# Patient Record
Sex: Female | Born: 1995 | Race: Black or African American | Hispanic: No | Marital: Single | Smoking: Never smoker
Health system: Southern US, Community
[De-identification: ages and names within clinical notes are randomized; demographics above are authoritative.]

## PROBLEM LIST (undated history)

## (undated) ENCOUNTER — Inpatient Hospital Stay (HOSPITAL_COMMUNITY): Payer: Self-pay

## (undated) DIAGNOSIS — Z789 Other specified health status: Secondary | ICD-10-CM

## (undated) HISTORY — PX: NO PAST SURGERIES: SHX2092

## (undated) HISTORY — PX: TOOTH EXTRACTION: SUR596

---

## 2016-09-02 NOTE — L&D Delivery Note (Signed)
Delivery Note Pt began with pressure and an urge to push just prior to delivery. She pushed well with a couple of ctx and at 12:29 AM a viable female was delivered via Vaginal, Spontaneous Delivery (Presentation: LOA ).  APGAR: 8, 9; weight: pending.  Infant dried and placed on pt's abd; family requested delayed cord clamping; cord clamped and cut by FOB; hospital cord blood sample collected Placenta status: spont, intact .  Cord: 3 vessel  Anesthesia: None  Episiotomy: None Lacerations: None Est. Blood Loss (mL): initially 150, then I was called back to the room shortly after leaving to eval a trickle/clots; bimanual exam removed some vag clots, and now total EBL 500cc- given cytotec buccally  Mom to postpartum.  Baby to Couplet care / Skin to Skin.  Cam Hai CNM 04/22/2017, 12:43 AM

## 2016-09-19 ENCOUNTER — Encounter (HOSPITAL_COMMUNITY): Payer: Self-pay | Admitting: Emergency Medicine

## 2016-09-19 ENCOUNTER — Ambulatory Visit (HOSPITAL_COMMUNITY)
Admission: EM | Admit: 2016-09-19 | Discharge: 2016-09-19 | Disposition: A | Discharge status: Home / Self Care | Payer: Medicaid Other | Attending: Emergency Medicine | Admitting: Emergency Medicine

## 2016-09-19 DIAGNOSIS — R197 Diarrhea, unspecified: Secondary | ICD-10-CM | POA: Diagnosis not present

## 2016-09-19 DIAGNOSIS — L509 Urticaria, unspecified: Secondary | ICD-10-CM | POA: Diagnosis not present

## 2016-09-19 DIAGNOSIS — R1030 Lower abdominal pain, unspecified: Secondary | ICD-10-CM | POA: Insufficient documentation

## 2016-09-19 DIAGNOSIS — R196 Halitosis: Secondary | ICD-10-CM

## 2016-09-19 DIAGNOSIS — G43009 Migraine without aura, not intractable, without status migrainosus: Secondary | ICD-10-CM

## 2016-09-19 DIAGNOSIS — Z79899 Other long term (current) drug therapy: Secondary | ICD-10-CM | POA: Insufficient documentation

## 2016-09-19 DIAGNOSIS — R109 Unspecified abdominal pain: Secondary | ICD-10-CM | POA: Diagnosis present

## 2016-09-19 MED ORDER — PREDNISONE 10 MG PO TABS: ORAL_TABLET | ORAL | 0 refills | Status: DC

## 2016-09-19 MED ORDER — LOPERAMIDE HCL 2 MG PO CAPS: 2.0000 mg | ORAL_CAPSULE | Freq: Two times a day (BID) | ORAL | 0 refills | Status: DC | PRN

## 2016-09-19 MED ORDER — KETOROLAC TROMETHAMINE 60 MG/2ML IM SOLN
INTRAMUSCULAR | Status: AC
  Filled 2016-09-19: qty 2

## 2016-09-19 MED ORDER — DEXAMETHASONE SODIUM PHOSPHATE 10 MG/ML IJ SOLN
10.0000 mg | Freq: Once | INTRAMUSCULAR | Status: AC
  Administered 2016-09-19: 10 mg via INTRAMUSCULAR

## 2016-09-19 MED ORDER — KETOROLAC TROMETHAMINE 60 MG/2ML IM SOLN
60.0000 mg | Freq: Once | INTRAMUSCULAR | Status: AC
  Administered 2016-09-19: 60 mg via INTRAMUSCULAR

## 2016-09-19 MED ORDER — DEXAMETHASONE SODIUM PHOSPHATE 10 MG/ML IJ SOLN
INTRAMUSCULAR | Status: AC
  Filled 2016-09-19: qty 1

## 2016-09-19 NOTE — ED Provider Notes (Signed)
[facility]    CSN: [id number] Arrival date & time: 09/19/16  1543     History   Chief Complaint Chief Complaint  Patient presents with  . Abdominal Pain    HPI Kelsey Hanson is a 21 y.o. female.   HPI She is a 89 year old woman here for evaluation of abdominal pain and diarrhea. She reports a four-week history of diarrhea. She reports having a bowel movement every 2 hours or so.Marland Kitchen Described as loose to watery. No blood in the stool. This is associated with some lower abdominal pain. Denies nausea or vomiting. No fevers. No recent travel out of the country.   She also reports a daily migraine for the last 2 weeks. It is her typical migraine. Initially, it was responding to OTC medications, but she stopped taking these due to hives.  She reports a month or so history of frequent hives. They respond to cool oatmeal baths.  History reviewed. No pertinent past medical history.  There are no active problems to display for this patient.   History reviewed. No pertinent surgical history.  OB History    No data available       Home Medications    Prior to Admission medications   Medication Sig Start Date End Date Taking? Authorizing Provider  loperamide (IMODIUM) 2 MG capsule Take 1 capsule (2 mg total) by mouth 2 (two) times daily as needed for diarrhea or loose stools. 09/19/16   Charm Rings, MD  predniSONE (DELTASONE) 10 MG tablet Take 6 tablets on day 1, 5 on day 2, 4 on day 3, 3 on day 4, 2 on day 5, 1 on day 6. Start 09/20/2016. 09/19/16   Charm Rings, MD    Family History No family history on file.  Social History Social History  Substance Use Topics  . Smoking status: Never Smoker  . Smokeless tobacco: Never Used  . Alcohol use No     Allergies   Patient has no known allergies.   Review of Systems Review of Systems As in history of present illness  Physical Exam Triage Vital Signs ED Triage Vitals  Enc Vitals Group     BP 09/19/16 1618  133/81     Pulse Rate 09/19/16 1618 98     Resp 09/19/16 1618 16     Temp 09/19/16 1618 99.4 F (37.4 C)     Temp Source 09/19/16 1618 Oral     SpO2 09/19/16 1618 100 %     Weight --      Height --      Head Circumference --      Peak Flow --      Pain Score 09/19/16 1617 8     Pain Loc --      Pain Edu? --      Excl. in GC? --    No data found.   Updated Vital Signs BP 133/81 (BP Location: Left Arm)   Pulse 98   Temp 99.4 F (37.4 C) (Oral)   Resp 16   LMP 08/26/2016   SpO2 100%   Visual Acuity Right Eye Distance:   Left Eye Distance:   Bilateral Distance:    Right Eye Near:   Left Eye Near:    Bilateral Near:     Physical Exam  Constitutional: She is oriented to person, place, and time. She appears well-developed and well-nourished. No distress.  HENT:  Mouth/Throat: Oropharynx is clear and moist.  Neck: Neck supple.  Cardiovascular: Normal  rate, regular rhythm and normal heart sounds.   No murmur heard. Pulmonary/Chest: Effort normal and breath sounds normal. No respiratory distress. She has no wheezes. She has no rales.  Abdominal: Soft. Bowel sounds are normal. She exhibits no distension. There is tenderness (across lower abdomen and epigastric). There is no rebound and no guarding.  Neurological: She is alert and oriented to person, place, and time.  Skin: No rash noted.     UC Treatments / Results  Labs (all labs ordered are listed, but only abnormal results are displayed) Labs Reviewed  GASTROINTESTINAL PANEL BY PCR, STOOL (REPLACES STOOL CULTURE)    EKG  EKG Interpretation None       Radiology No results found.  Procedures Procedures (including critical care time)  Medications Ordered in UC Medications  ketorolac (TORADOL) injection 60 mg (60 mg Intramuscular Given 09/19/16 1719)  dexamethasone (DECADRON) injection 10 mg (10 mg Intramuscular Given 09/19/16 1720)     Initial Impression / Assessment and Plan / UC Course  I have  reviewed the triage vital signs and the nursing notes.  Pertinent labs & imaging results that were available during my care of the patient were reviewed by me and considered in my medical decision making (see chart for details).     Migraine treated with Toradol and Decadron. Prednisone taper to help with headache and hives. Stool culture sent. Recommended loperamide twice a day as needed to help with the diarrhea. Follow-up as needed.  Final Clinical Impressions(s) / UC Diagnoses   Final diagnoses:  Diarrhea, unspecified type  Migraine without aura and without status migrainosus, not intractable  Bad breath  Hives    New Prescriptions New Prescriptions   LOPERAMIDE (IMODIUM) 2 MG CAPSULE    Take 1 capsule (2 mg total) by mouth 2 (two) times daily as needed for diarrhea or loose stools.   PREDNISONE (DELTASONE) 10 MG TABLET    Take 6 tablets on day 1, 5 on day 2, 4 on day 3, 3 on day 4, 2 on day 5, 1 on day 6. Start 09/20/2016.     Charm RingsErin J Herman Fiero, MD 09/19/16 [phone]

## 2016-09-19 NOTE — ED Triage Notes (Signed)
Here abd pain onset 08/24/16 associated w/constant diarrhea, HA x2 weeks  Did not come in sooner due to lack of transportation   Denies fevers, n/v, urinary sx  Not taking any meds   A&O x4... NAD

## 2016-09-19 NOTE — ED Notes (Signed)
Patient sent to the bathroom

## 2016-09-19 NOTE — Discharge Instructions (Signed)
We gave you some medication here to help with the headache. Take the prednisone taper as prescribed. With a headache as well as the hives. We are going to test your stool for infection. We will call you with the results in a few days. In the meantime, take loperamide twice a day as needed for diarrhea. If your symptoms do not improve, we may need to refer you to a GI specialist.

## 2016-09-19 NOTE — ED Notes (Signed)
Patient has returned to treatment room from bathroom

## 2016-09-20 LAB — GASTROINTESTINAL PANEL BY PCR, STOOL (REPLACES STOOL CULTURE)
ADENOVIRUS F40/41: NOT DETECTED
ASTROVIRUS: NOT DETECTED
CAMPYLOBACTER SPECIES: NOT DETECTED
CYCLOSPORA CAYETANENSIS: NOT DETECTED
Cryptosporidium: NOT DETECTED
ENTAMOEBA HISTOLYTICA: NOT DETECTED
ENTEROPATHOGENIC E COLI (EPEC): NOT DETECTED
ENTEROTOXIGENIC E COLI (ETEC): NOT DETECTED
Enteroaggregative E coli (EAEC): NOT DETECTED
Giardia lamblia: NOT DETECTED
Norovirus GI/GII: NOT DETECTED
Plesimonas shigelloides: NOT DETECTED
Rotavirus A: NOT DETECTED
Salmonella species: NOT DETECTED
Sapovirus (I, II, IV, and V): NOT DETECTED
Shiga like toxin producing E coli (STEC): NOT DETECTED
Shigella/Enteroinvasive E coli (EIEC): NOT DETECTED
VIBRIO CHOLERAE: NOT DETECTED
VIBRIO SPECIES: NOT DETECTED
Yersinia enterocolitica: NOT DETECTED

## 2016-10-14 ENCOUNTER — Inpatient Hospital Stay (HOSPITAL_COMMUNITY)
Admission: AD | Admit: 2016-10-14 | Discharge: 2016-10-15 | Disposition: A | Discharge status: Home / Self Care | Payer: Medicaid Other | Source: Ambulatory Visit | Attending: Obstetrics and Gynecology | Admitting: Obstetrics and Gynecology

## 2016-10-14 ENCOUNTER — Inpatient Hospital Stay (HOSPITAL_COMMUNITY): Payer: Medicaid Other

## 2016-10-14 ENCOUNTER — Encounter (HOSPITAL_COMMUNITY): Payer: Self-pay | Admitting: *Deleted

## 2016-10-14 DIAGNOSIS — K219 Gastro-esophageal reflux disease without esophagitis: Secondary | ICD-10-CM | POA: Insufficient documentation

## 2016-10-14 DIAGNOSIS — R300 Dysuria: Secondary | ICD-10-CM | POA: Insufficient documentation

## 2016-10-14 DIAGNOSIS — O2341 Unspecified infection of urinary tract in pregnancy, first trimester: Secondary | ICD-10-CM | POA: Insufficient documentation

## 2016-10-14 DIAGNOSIS — O468X1 Other antepartum hemorrhage, first trimester: Secondary | ICD-10-CM

## 2016-10-14 DIAGNOSIS — O219 Vomiting of pregnancy, unspecified: Secondary | ICD-10-CM | POA: Insufficient documentation

## 2016-10-14 DIAGNOSIS — Z79899 Other long term (current) drug therapy: Secondary | ICD-10-CM | POA: Diagnosis not present

## 2016-10-14 DIAGNOSIS — O99611 Diseases of the digestive system complicating pregnancy, first trimester: Secondary | ICD-10-CM | POA: Diagnosis not present

## 2016-10-14 DIAGNOSIS — O418X10 Other specified disorders of amniotic fluid and membranes, first trimester, not applicable or unspecified: Secondary | ICD-10-CM | POA: Diagnosis not present

## 2016-10-14 DIAGNOSIS — O208 Other hemorrhage in early pregnancy: Secondary | ICD-10-CM | POA: Diagnosis not present

## 2016-10-14 DIAGNOSIS — O26891 Other specified pregnancy related conditions, first trimester: Secondary | ICD-10-CM | POA: Insufficient documentation

## 2016-10-14 DIAGNOSIS — R1030 Lower abdominal pain, unspecified: Secondary | ICD-10-CM | POA: Diagnosis not present

## 2016-10-14 LAB — URINALYSIS, ROUTINE W REFLEX MICROSCOPIC
BILIRUBIN URINE: NEGATIVE
Glucose, UA: NEGATIVE mg/dL
KETONES UR: 20 mg/dL — AB
NITRITE: NEGATIVE
PROTEIN: 30 mg/dL — AB
SPECIFIC GRAVITY, URINE: 1.013 (ref 1.005–1.030)
pH: 5 (ref 5.0–8.0)

## 2016-10-14 LAB — CBC
HCT: 34 % — ABNORMAL LOW (ref 36.0–46.0)
Hemoglobin: 11.3 g/dL — ABNORMAL LOW (ref 12.0–15.0)
MCH: 25.6 pg — ABNORMAL LOW (ref 26.0–34.0)
MCHC: 33.2 g/dL (ref 30.0–36.0)
MCV: 77.1 fL — ABNORMAL LOW (ref 78.0–100.0)
Platelets: 262 K/uL (ref 150–400)
RBC: 4.41 MIL/uL (ref 3.87–5.11)
RDW: 15.2 % (ref 11.5–15.5)
WBC: 9.4 K/uL (ref 4.0–10.5)

## 2016-10-14 LAB — POCT PREGNANCY, URINE: Preg Test, Ur: POSITIVE — AB

## 2016-10-14 LAB — HCG, QUANTITATIVE, PREGNANCY: hCG, Beta Chain, Quant, S: 85674 m[IU]/mL — ABNORMAL HIGH

## 2016-10-14 MED ORDER — PROMETHAZINE HCL 25 MG PO TABS: 25.0000 mg | ORAL_TABLET | Freq: Four times a day (QID) | ORAL | 1 refills | Status: DC | PRN

## 2016-10-14 MED ORDER — PHENAZOPYRIDINE HCL 200 MG PO TABS: 200.0000 mg | ORAL_TABLET | Freq: Three times a day (TID) | ORAL | 0 refills | Status: DC | PRN

## 2016-10-14 MED ORDER — CEPHALEXIN 500 MG PO CAPS: 500.0000 mg | ORAL_CAPSULE | Freq: Four times a day (QID) | ORAL | 0 refills | Status: DC

## 2016-10-14 MED ORDER — CONCEPT OB 130-92.4-1 MG PO CAPS: 1.0000 | ORAL_CAPSULE | Freq: Every day | ORAL | 12 refills | Status: DC

## 2016-10-14 NOTE — MAU Note (Signed)
PT  SAYS   LAST   NIGHT SHE  WOKE  - WITH  CRAMPING - WITH  B-ROOM - UNABLE  TO VOID.    THEN  TODAY  SHE  NOTICED  BLOOD  ON TOILET  PAPER.  SAYS SHE  CANNOT  CONTROL  HER  URINE-    HAS  BURNING  -   STARTED    1 WEEK  AGO.      NO HPT.      NO BIRTH  CONTROL      NO  DR.

## 2016-10-14 NOTE — Discharge Instructions (Signed)
Subchorionic Hematoma A subchorionic hematoma is a gathering of blood between the outer wall of the placenta and the inner wall of the womb (uterus). The placenta is the organ that connects the fetus to the wall of the uterus. The placenta performs the feeding, breathing (oxygen to the fetus), and waste removal (excretory work) of the fetus.  Subchorionic hematoma is the most common abnormality found on a result from ultrasonography done during the first trimester or early second trimester of pregnancy. If there has been little or no vaginal bleeding, early small hematomas usually shrink on their own and do not affect your baby or pregnancy. The blood is gradually absorbed over 1-2 weeks. When bleeding starts later in pregnancy or the hematoma is larger or occurs in an older pregnant woman, the outcome may not be as good. Larger hematomas may get bigger, which increases the chances for miscarriage. Subchorionic hematoma also increases the risk of premature detachment of the placenta from the uterus, preterm (premature) labor, and stillbirth. HOME CARE INSTRUCTIONS  Stay on bed rest if your health care provider recommends this. Although bed rest will not prevent more bleeding or prevent a miscarriage, your health care provider may recommend bed rest until you are advised otherwise.  Avoid heavy lifting (more than 10 lb [4.5 kg]), exercise, sexual intercourse, or douching as directed by your health care provider.  Keep track of the number of pads you use each day and how soaked (saturated) they are. Write down this information.  Do not use tampons.  Keep all follow-up appointments as directed by your health care provider. Your health care provider may ask you to have follow-up blood tests or ultrasound tests or both. SEEK IMMEDIATE MEDICAL CARE IF:  You have severe cramps in your stomach, back, abdomen, or pelvis.  You have a fever.  You pass large clots or tissue. Save any tissue for your health  care provider to look at.  Your bleeding increases or you become lightheaded, feel weak, or have fainting episodes. This information is not intended to replace advice given to you by your health care provider. Make sure you discuss any questions you have with your health care provider. Document Released: 12/04/2006 Document Revised: 09/09/2014 Document Reviewed: 03/18/2013 Elsevier Interactive Patient Education  2017 [employer]   Pregnancy and Urinary Tract Infection WHAT IS A URINARY TRACT INFECTION? A urinary tract infection (UTI) is an infection of any part of the urinary tract. This includes the kidneys, the tubes that connect your kidneys to your bladder (ureters), the bladder, and the tube that carries urine out of your body (urethra). These organs make, store, and get rid of urine in the body. A UTI can be a bladder infection (cystitis) or a kidney infection (pyelonephritis). This infection may be caused by fungi, viruses, and bacteria. Bacteria are the most common cause of UTIs. You are more likely to develop a UTI during pregnancy because:  The physical and hormonal changes your body goes through can make it easier for bacteria to get into your urinary tract.  Your growing baby puts pressure on your uterus and can affect urine flow. DOES A UTI PLACE MY BABY AT RISK? An untreated UTI during pregnancy could lead to a kidney infection, which can cause health problems that could affect your baby. Possible complications of an untreated UTI include:  Having your baby before 37 weeks of pregnancy (premature).  Having a baby with a low birth weight.  Developing high blood pressure during pregnancy (preeclampsia). WHAT  ARE THE SYMPTOMS OF A UTI? Symptoms of a UTI include:  Fever.  Frequent urination or passing small amounts of urine frequently.  Needing to urinate urgently.  Pain or a burning sensation with urination.  Urine that smells bad or unusual.  Cloudy urine.  Pain  in the lower abdomen or back.  Trouble urinating.  Blood in the urine.  Vomiting or being less hungry than normal.  Diarrhea or abdominal pain.  Vaginal discharge. WHAT ARE THE TREATMENT OPTIONS FOR A UTI DURING PREGNANCY? Treatment for this condition may include:  Antibiotic medicines that are safe to take during pregnancy.  Other medicines to treat less common causes of UTI. HOW CAN I PREVENT A UTI? To prevent a UTI:  Go to the bathroom as soon as you feel the need.  Always wipe from front to back.  Wash your genital area with soap and warm water daily.  Empty your bladder before and after sex.  Wear cotton underwear.  Limit your intake of high sugar foods or drinks, such as regular soda, juice, and sweets..  Drink 6-8 glasses of water daily.  Do not wear tight-fitting pants.  Do not douche or use deodorant sprays.  Do not drink alcohol, caffeine, or carbonated drinks. These can irritate the bladder. WHEN SHOULD I SEEK MEDICAL CARE? Seek medical care if:  Your symptoms do not improve or get worse.  You have a fever after two days of treatment.  You have a rash.  You have abnormal vaginal discharge.  You have back or side pain.  You have chills.  You have nausea and vomiting. WHEN SHOULD I SEEK IMMEDIATE MEDICAL CARE? Seek immediate medical care if you are pregnant and:  You feel contractions in your uterus.  You have lower belly pain.  You have a gush of fluid from your vagina.  You have blood in your urine.  You are vomiting and cannot keep down any medicines or water. This information is not intended to replace advice given to you by your health care provider. Make sure you discuss any questions you have with your health care provider. Document Released: 12/14/2010 Document Revised: 01/22/2016 Document Reviewed: 07/10/2015 Elsevier Interactive Patient Education  2017 [employer].  Food Choices for Gastroesophageal Reflux Disease,  Adult When you have gastroesophageal reflux disease (GERD), the foods you eat and your eating habits are very important. Choosing the right foods can help ease the discomfort of GERD. What general guidelines do I need to follow?  Choose fruits, vegetables, whole grains, low-fat dairy products, and low-fat meat, fish, and poultry.  Limit fats such as oils, salad dressings, butter, nuts, and avocado.  Keep a food diary to identify foods that cause symptoms.  Avoid foods that cause reflux. These may be different for different people.  Eat frequent small meals instead of three large meals each day.  Eat your meals slowly, in a relaxed setting.  Limit fried foods.  Cook foods using methods other than frying.  Avoid drinking alcohol.  Avoid drinking large amounts of liquids with your meals.  Avoid bending over or lying down until 2-3 hours after eating. What foods are not recommended? The following are some foods and drinks that may worsen your symptoms: Vegetables  Tomatoes. Tomato juice. Tomato and spaghetti sauce. Chili peppers. Onion and garlic. Horseradish. Fruits  Oranges, grapefruit, and lemon (fruit and juice). Meats  High-fat meats, fish, and poultry. This includes hot dogs, ribs, ham, sausage, salami, and bacon. Dairy  Whole milk and chocolate  milk. Sour cream. Cream. Butter. Ice cream. Cream cheese. Beverages  Coffee and tea, with or without caffeine. Carbonated beverages or energy drinks. Condiments  Hot sauce. Barbecue sauce. Sweets/Desserts  Chocolate and cocoa. Donuts. Peppermint and spearmint. Fats and Oils  High-fat foods, including [place] fries and potato chips. Other  Vinegar. Strong spices, such as black pepper, white pepper, red pepper, cayenne, curry powder, cloves, ginger, and chili powder. The items listed above may not be a complete list of foods and beverages to avoid. Contact your dietitian for more information.  This information is not intended to  replace advice given to you by your health care provider. Make sure you discuss any questions you have with your health care provider. Document Released: 08/19/2005 Document Revised: 01/25/2016 Document Reviewed: 06/23/2013 Elsevier Interactive Patient Education  2017 [employer].

## 2016-10-15 LAB — ABO/RH: ABO/RH(D): O POS

## 2016-10-15 NOTE — MAU Provider Note (Signed)
Chief Complaint: Abdominal Cramping   First Provider Initiated Contact with Patient 10/14/16 2350     SUBJECTIVE HPI: Kelsey Hanson is a 21 y.o. G1P0000 at [redacted]w[redacted]d who presents to Maternity Admissions reporting low abd cramping, light vaginal bleeding since last night and dysuria x 1 week. Leaking urine today. No testing this pregnancy.   Location: low abd Quality: cramping Severity: 8/10 on pain scale Duration: 24 hours Course: unchanged Context: Early pregnancy Timing: intermittent Modifying factors: None hasn't tried anything for pain.  Associated signs and symptoms: Pos for vaginal bleeding, dysuria, urgency, frequency, leaking urine, nausea, heartburn. Neg for fever, chills, V/D/C, hematuria, flank pain, passage of clots or tissue.   No past medical history on file. OB History  Gravida Para Term Preterm AB Living  1 0 0 0 0 0  SAB TAB Ectopic Multiple Live Births  0 0 0 0 0    # Outcome Date GA Lbr Len/2nd Weight Sex Delivery Anes PTL Lv  1 Current              No past surgical history on file. Social History   Social History  . Marital status: Married    Spouse name: N/A  . Number of children: N/A  . Years of education: N/A   Occupational History  . Not on file.   Social History Main Topics  . Smoking status: Never Smoker  . Smokeless tobacco: Never Used  . Alcohol use No  . Drug use: Unknown  . Sexual activity: Yes   Other Topics Concern  . Not on file   Social History Narrative  . No narrative on file   No current facility-administered medications on file prior to encounter.    Current Outpatient Prescriptions on File Prior to Encounter  Medication Sig Dispense Refill  . loperamide (IMODIUM) 2 MG capsule Take 1 capsule (2 mg total) by mouth 2 (two) times daily as needed for diarrhea or loose stools. 30 capsule 0  . predniSONE (DELTASONE) 10 MG tablet Take 6 tablets on day 1, 5 on day 2, 4 on day 3, 3 on day 4, 2 on day 5, 1 on day 6. Start 09/20/2016.  21 tablet 0   No Known Allergies  I have reviewed the past Medical Hx, Surgical Hx, Social Hx, Allergies and Medications.   Review of Systems  Constitutional: Negative for appetite change, chills and fever.  Gastrointestinal: Positive for abdominal pain and nausea. Negative for abdominal distention, blood in stool, constipation, diarrhea and vomiting.  Genitourinary: Positive for dysuria, frequency, urgency and vaginal bleeding. Negative for flank pain, hematuria, vaginal discharge and vaginal pain.  Musculoskeletal: Negative for back pain.  Neurological: Negative for dizziness.    OBJECTIVE Patient Vitals for the past 24 hrs:  BP Temp Temp src Pulse Resp Weight  10/14/16 2353 (!) 113/49 98.6 F (37 C) Oral 82 16 -  10/14/16 1926 143/65 99.1 F (37.3 C) Oral 110 20 141 lb 12 oz (64.3 kg)   Constitutional: Well-developed, well-nourished female in no acute distress.  Cardiovascular: normal rate Respiratory: normal rate and effort.  GI: Abd soft, non-tender. Pos BS x 4 MS: Extremities nontender, no edema, normal ROM Neurologic: Alert and oriented x 4.  GU: Neg CVAT.  SPECULUM EXAM: NEFG, physiologic discharge, no blood noted, cervix clean  BIMANUAL: cervix closed; uterus slightly enlarged, no adnexal tenderness or masses.  No CMT.  LAB RESULTS Results for orders placed or performed during the hospital encounter of 10/14/16 (from the past 24 hour(s))  Urinalysis, Routine w reflex microscopic     Status: Abnormal   Collection Time: 10/14/16  7:18 PM  Result Value Ref Range   Color, Urine YELLOW YELLOW   APPearance CLOUDY (A) CLEAR   Specific Gravity, Urine 1.013 1.005 - 1.030   pH 5.0 5.0 - 8.0   Glucose, UA NEGATIVE NEGATIVE mg/dL   Hgb urine dipstick LARGE (A) NEGATIVE   Bilirubin Urine NEGATIVE NEGATIVE   Ketones, ur 20 (A) NEGATIVE mg/dL   Protein, ur 30 (A) NEGATIVE mg/dL   Nitrite NEGATIVE NEGATIVE   Leukocytes, UA LARGE (A) NEGATIVE   RBC / HPF TOO NUMEROUS TO COUNT  0 - 5 RBC/hpf   WBC, UA TOO NUMEROUS TO COUNT 0 - 5 WBC/hpf   Bacteria, UA RARE (A) NONE SEEN   Squamous Epithelial / LPF 0-5 (A) NONE SEEN   Mucous PRESENT   Pregnancy, urine POC     Status: Abnormal   Collection Time: 10/14/16  7:32 PM  Result Value Ref Range   Preg Test, Ur POSITIVE (A) NEGATIVE  hCG, quantitative, pregnancy     Status: Abnormal   Collection Time: 10/14/16  8:46 PM  Result Value Ref Range   hCG, Beta Chain, Quant, S 85,674 (H) <5 mIU/mL  ABO/Rh     Status: None (Preliminary result)   Collection Time: 10/14/16  8:46 PM  Result Value Ref Range   ABO/RH(D) O POS   CBC     Status: Abnormal   Collection Time: 10/14/16  8:46 PM  Result Value Ref Range   WBC 9.4 4.0 - 10.5 K/uL   RBC 4.41 3.87 - 5.11 MIL/uL   Hemoglobin 11.3 (L) 12.0 - 15.0 g/dL   HCT 40.9 (L) 81.1 - 91.4 %   MCV 77.1 (L) 78.0 - 100.0 fL   MCH 25.6 (L) 26.0 - 34.0 pg   MCHC 33.2 30.0 - 36.0 g/dL   RDW 78.2 95.6 - 21.3 %   Platelets 262 150 - 400 K/uL    IMAGING US Ob Comp Less 14 Wks  Result Date: 10/14/2016 CLINICAL DATA:  Acute onset of vaginal bleeding.  Initial encounter. EXAM: OBSTETRIC <14 WK [place] AND TRANSVAGINAL OB US TECHNIQUE: Both transabdominal and transvaginal ultrasound examinations were performed for complete evaluation of the gestation as well as the maternal uterus, adnexal regions, and pelvic cul-de-sac. Transvaginal technique was performed to assess early pregnancy. COMPARISON:  None. FINDINGS: Intrauterine gestational sac: Single; visualized and normal in shape. Yolk sac:  Yes Embryo:  Yes Cardiac Activity: Yes Heart Rate: 183  bpm CRL:  1.96 cm   8 w   3 d                  [place] EDC: 05/23/2017 Subchorionic hemorrhage: A small amount of subchorionic hemorrhage is noted. Maternal uterus/adnexae: The uterus otherwise unremarkable appearance The ovaries are within normal limits. The right ovary measures 2.8 x 2.2 x 2.2 cm, while the left ovary measures 2.3 x 1.4 x 1.8 cm. No suspicious  adnexal masses are seen; there is no evidence for ovarian torsion. No free fluid is seen within the pelvic cul-de-sac. IMPRESSION: 1. Single live intrauterine pregnancy noted, with a crown-rump length of 2.0 cm, corresponding to a gestational age of [redacted] weeks 3 days. This matches the gestational age of [redacted] weeks 2 days by LMP, reflecting an estimated date of delivery of May 24, 2017. 2. Small amount of subchorionic hemorrhage noted. Electronically Signed   By: Beryle Beams.D.  On: 10/14/2016 21:25   [place] Ob Transvaginal  Result Date: 10/14/2016 CLINICAL DATA:  Acute onset of vaginal bleeding.  Initial encounter. EXAM: OBSTETRIC <14 WK US AND TRANSVAGINAL OB US TECHNIQUE: Both transabdominal and transvaginal ultrasound examinations were performed for complete evaluation of the gestation as well as the maternal uterus, adnexal regions, and pelvic cul-de-sac. Transvaginal technique was performed to assess early pregnancy. COMPARISON:  None. FINDINGS: Intrauterine gestational sac: Single; visualized and normal in shape. Yolk sac:  Yes Embryo:  Yes Cardiac Activity: Yes Heart Rate: 183  bpm CRL:  1.96 cm   8 w   3 d                  [place] EDC: 05/23/2017 Subchorionic hemorrhage: A small amount of subchorionic hemorrhage is noted. Maternal uterus/adnexae: The uterus otherwise unremarkable appearance The ovaries are within normal limits. The right ovary measures 2.8 x 2.2 x 2.2 cm, while the left ovary measures 2.3 x 1.4 x 1.8 cm. No suspicious adnexal masses are seen; there is no evidence for ovarian torsion. No free fluid is seen within the pelvic cul-de-sac. IMPRESSION: 1. Single live intrauterine pregnancy noted, with a crown-rump length of 2.0 cm, corresponding to a gestational age of [redacted] weeks 3 days. This matches the gestational age of [redacted] weeks 2 days by LMP, reflecting an estimated date of delivery of May 24, 2017. 2. Small amount of subchorionic hemorrhage noted. Electronically Signed   By: Roanna RaiderJeffery  Chang  M.D.   On: 10/14/2016 21:25    MAU COURSE CBC, Quant, ABO/Rh, ultrasound, wet prep and GC/chlamydia culture, UA  MDM - Abd Pain in early pregnancy with normal intrauterine pregnancy and hemodynamically stable. Pain likely 2/2 UTI. Will Tx w/ Keflex, Pyridium for comfort and reduce bladder spasms. - [facility]  ASSESSMENT 1. Subchorionic hematoma in first trimester, single or unspecified fetus   2. UTI (urinary tract infection) during pregnancy, first trimester   3. Gastroesophageal reflux during pregnancy in first trimester, antepartum   4. Nausea and vomiting during pregnancy     PLAN Discharge home in stable condition. Bleeding precautions Pelvic rest x 1 week.  Comfort measures.  List of providers given. Pregnancy verification letter given. Follow-up Information    obstetrician of your choice Follow up.   Why:  Start prenatal care       [facility] Follow up.   Why:  as needed in emergencies Contact information: [address] [id number] [place] [id number] [phone]         Allergies as of 10/15/2016   No Known Allergies     Medication List    STOP taking these medications   loperamide 2 MG capsule Commonly known as:  IMODIUM   predniSONE 10 MG tablet Commonly known as:  DELTASONE     TAKE these medications   cephALEXin 500 MG capsule Commonly known as:  KEFLEX Take 1 capsule (500 mg total) by mouth 4 (four) times daily.   CONCEPT OB 130-92.4-1 MG Caps Take 1 tablet by mouth daily.   phenazopyridine 200 MG tablet Commonly known as:  PYRIDIUM Take 1 tablet (200 mg total) by mouth 3 (three) times daily as needed for pain.   promethazine 25 MG tablet Commonly known as:  PHENERGAN Take 1 tablet (25 mg total) by mouth every 6 (six) hours as needed.        [place] Hadlie Gipson, CNM 10/15/2016  12:01 AM  4

## 2016-10-16 LAB — CULTURE, OB URINE: Special Requests: NORMAL

## 2016-10-16 LAB — HIV ANTIBODY (ROUTINE TESTING W REFLEX): HIV Screen 4th Generation wRfx: NONREACTIVE

## 2017-01-08 ENCOUNTER — Ambulatory Visit (INDEPENDENT_AMBULATORY_CARE_PROVIDER_SITE_OTHER): Payer: Medicaid Other | Admitting: Certified Nurse Midwife

## 2017-01-08 ENCOUNTER — Other Ambulatory Visit (HOSPITAL_COMMUNITY)
Admission: RE | Admit: 2017-01-08 | Discharge: 2017-01-08 | Disposition: A | Discharge status: Home / Self Care | Payer: Medicaid Other | Source: Ambulatory Visit | Attending: Certified Nurse Midwife | Admitting: Certified Nurse Midwife

## 2017-01-08 ENCOUNTER — Encounter: Payer: Self-pay | Admitting: Certified Nurse Midwife

## 2017-01-08 ENCOUNTER — Encounter: Payer: Medicaid Other | Admitting: Certified Nurse Midwife

## 2017-01-08 ENCOUNTER — Encounter (HOSPITAL_COMMUNITY): Payer: Self-pay | Admitting: *Deleted

## 2017-01-08 ENCOUNTER — Inpatient Hospital Stay (HOSPITAL_COMMUNITY)
Admission: AD | Admit: 2017-01-08 | Discharge: 2017-01-08 | Disposition: A | Discharge status: Home / Self Care | Payer: Medicaid Other | Source: Ambulatory Visit | Attending: Family Medicine | Admitting: Family Medicine

## 2017-01-08 VITALS — BP 118/83 | HR 103 | Ht 64.0 in | Wt 127.0 lb

## 2017-01-08 DIAGNOSIS — O211 Hyperemesis gravidarum with metabolic disturbance: Secondary | ICD-10-CM

## 2017-01-08 DIAGNOSIS — Z3A22 22 weeks gestation of pregnancy: Secondary | ICD-10-CM | POA: Diagnosis not present

## 2017-01-08 DIAGNOSIS — O99282 Endocrine, nutritional and metabolic diseases complicating pregnancy, second trimester: Secondary | ICD-10-CM | POA: Diagnosis not present

## 2017-01-08 DIAGNOSIS — O2342 Unspecified infection of urinary tract in pregnancy, second trimester: Secondary | ICD-10-CM

## 2017-01-08 DIAGNOSIS — Z3400 Encounter for supervision of normal first pregnancy, unspecified trimester: Secondary | ICD-10-CM | POA: Insufficient documentation

## 2017-01-08 DIAGNOSIS — N870 Mild cervical dysplasia: Secondary | ICD-10-CM | POA: Diagnosis not present

## 2017-01-08 DIAGNOSIS — O210 Mild hyperemesis gravidarum: Secondary | ICD-10-CM | POA: Diagnosis not present

## 2017-01-08 DIAGNOSIS — Z818 Family history of other mental and behavioral disorders: Secondary | ICD-10-CM | POA: Diagnosis not present

## 2017-01-08 DIAGNOSIS — Z3402 Encounter for supervision of normal first pregnancy, second trimester: Secondary | ICD-10-CM

## 2017-01-08 DIAGNOSIS — E860 Dehydration: Secondary | ICD-10-CM | POA: Diagnosis not present

## 2017-01-08 DIAGNOSIS — O219 Vomiting of pregnancy, unspecified: Secondary | ICD-10-CM

## 2017-01-08 DIAGNOSIS — O0931 Supervision of pregnancy with insufficient antenatal care, first trimester: Secondary | ICD-10-CM | POA: Insufficient documentation

## 2017-01-08 DIAGNOSIS — Z803 Family history of malignant neoplasm of breast: Secondary | ICD-10-CM | POA: Insufficient documentation

## 2017-01-08 DIAGNOSIS — O0932 Supervision of pregnancy with insufficient antenatal care, second trimester: Secondary | ICD-10-CM

## 2017-01-08 HISTORY — DX: Other specified health status: Z78.9

## 2017-01-08 LAB — URINALYSIS, ROUTINE W REFLEX MICROSCOPIC
BILIRUBIN URINE: NEGATIVE
Glucose, UA: NEGATIVE mg/dL
Ketones, ur: 20 mg/dL — AB
Nitrite: NEGATIVE
PH: 6 (ref 5.0–8.0)
Protein, ur: 30 mg/dL — AB
SPECIFIC GRAVITY, URINE: 1.026 (ref 1.005–1.030)

## 2017-01-08 LAB — OB RESULTS CONSOLE GC/CHLAMYDIA: GC PROBE AMP, GENITAL: NEGATIVE

## 2017-01-08 MED ORDER — PRENATE PIXIE 10-0.6-0.4-200 MG PO CAPS: 1.0000 | ORAL_CAPSULE | Freq: Every day | ORAL | 12 refills | Status: DC

## 2017-01-08 MED ORDER — CEPHALEXIN 500 MG PO CAPS: 500.0000 mg | ORAL_CAPSULE | Freq: Four times a day (QID) | ORAL | 0 refills | Status: DC

## 2017-01-08 MED ORDER — DEXTROSE 5 % IN LACTATED RINGERS IV BOLUS
1000.0000 mL | Freq: Once | INTRAVENOUS | Status: AC
Start: 2017-01-08 — End: 2017-01-08
  Administered 2017-01-08: 1000 mL via INTRAVENOUS

## 2017-01-08 MED ORDER — PROMETHAZINE HCL 25 MG RE SUPP: 25.0000 mg | Freq: Four times a day (QID) | RECTAL | 0 refills | Status: DC | PRN

## 2017-01-08 MED ORDER — FAMOTIDINE IN NACL 20-0.9 MG/50ML-% IV SOLN
20.0000 mg | Freq: Once | INTRAVENOUS | Status: AC
  Administered 2017-01-08: 20 mg via INTRAVENOUS
  Filled 2017-01-08: qty 50

## 2017-01-08 MED ORDER — DOXYLAMINE-PYRIDOXINE ER 20-20 MG PO TBCR: 1.0000 | EXTENDED_RELEASE_TABLET | Freq: Two times a day (BID) | ORAL | 6 refills | Status: DC

## 2017-01-08 MED ORDER — ONDANSETRON HCL 8 MG PO TABS: 8.0000 mg | ORAL_TABLET | Freq: Three times a day (TID) | ORAL | 2 refills | Status: DC | PRN

## 2017-01-08 MED ORDER — SODIUM CHLORIDE 0.9 % IV SOLN
8.0000 mg | Freq: Once | INTRAVENOUS | Status: AC
  Administered 2017-01-08: 8 mg via INTRAVENOUS
  Filled 2017-01-08: qty 4

## 2017-01-08 NOTE — MAU Note (Signed)
Unable to void

## 2017-01-08 NOTE — Progress Notes (Signed)
Subjective:    Kelsey Hanson is being seen today for her first obstetrical visit.  This is not a planned pregnancy.  She is at [redacted]w[redacted]d gestation. Her obstetrical history is significant for late to pnc. Relationship with FOB: spouse, living together. Patient does intend to breast feed. Pregnancy history fully reviewed. Patient is complained of nausea when she awakens and during the day,patient does c/o vomiting almost every day at least 3 to 5 times a week and atleast  3to 5 times a day. Patient admits to vomiting 8 times one day last week.   Patient states she has has a signifigant weight loss during this pregnancy, almost 20 lbs " I even threw up water this morning   The information documented in the HPI was reviewed and verified.  Menstrual History: OB History    Gravida Para Term Preterm AB Living   1 0 0 0 0 0   SAB TAB Ectopic Multiple Live Births   0 0 0 0 0      Menarche age: 58 Patient's last menstrual period was 08/17/2016.  Patient menses comings every 28 to 30 days, with normal bleeding and last 7 to 10 days,patient has no history of birth control use.     History reviewed. No pertinent past medical history.  History reviewed. No pertinent surgical history.   (Not in a hospital admission) No Known Allergies  Social History  Substance Use Topics  . Smoking status: Never Smoker  . Smokeless tobacco: Never Used  . Alcohol use No    Family History  Problem Relation Age of Onset  . Breast cancer Maternal Aunt      Review of Systems Constitutional: negative for weight loss Gastrointestinal: + for  Cyclical vomiting up to 10 times/day.  Has not kept food down since Monday.  Genitourinary:negative for genital lesions and vaginal discharge and dysuria Musculoskeletal:negative for back pain Behavioral/Psych: negative for abusive relationship, depression, illegal drug usage and tobacco use    Objective:    BP 118/83   Pulse (!) 103   Ht 5\' 4"  (1.626 m)   Wt 127 lb (57.6  kg)   LMP 08/17/2016   BMI 21.80 kg/m  General Appearance:    Alert, cooperative, no distress, appears stated age  Head:    Normocephalic, without obvious abnormality, atraumatic  Eyes:    PERRL, conjunctiva/corneas clear, EOM's intact, fundi    benign, both eyes  Ears:    Normal TM's and external ear canals, both ears  Nose:   Nares normal, septum midline, mucosa normal, no drainage    or sinus tenderness  Throat:   Lips, mucosa, and tongue normal; teeth and gums normal  Neck:   Supple, symmetrical, trachea midline, no adenopathy;    thyroid:  no enlargement/tenderness/nodules; no carotid   bruit or JVD  Back:     Symmetric, no curvature, ROM normal, no CVA tenderness  Lungs:     Clear to auscultation bilaterally, respirations unlabored  Chest Wall:    No tenderness or deformity   Heart:    Regular rate and rhythm, S1 and S2 normal, no murmur, rub   or gallop  Breast Exam:    No tenderness, masses, or nipple abnormality  Abdomen:     Soft, non-tender, bowel sounds active all four quadrants,    no masses, no organomegaly  Genitalia:    Normal female without lesion, discharge or tenderness  Extremities:   Extremities normal, atraumatic, no cyanosis or edema  Pulses:   2+  and symmetric all extremities  Skin:   Skin color, texture, turgor normal, no rashes or lesions  Lymph nodes:   Cervical, supraclavicular, and axillary nodes normal  Neurologic:   CNII-XII intact, normal strength, sensation and reflexes    throughout        Cervix:  Long, thick, closed and posterior.  FHR: 155 by doppler.     Lab Review Urine pregnancy test Labs reviewed yes Radiologic studies reviewed yes  Assessment & Plan    Pregnancy at 853w4d weeks ,FHT present,s=D  1. Supervision of normal first pregnancy, antepartum       Hyperemesis - Cytology - PAP - Hemoglobin A1c - Vitamin D (25 hydroxy) - MaterniT21 PLUS Core+SCA - Cervicovaginal ancillary only - Culture, OB Urine - Varicella zoster antibody,  IgG - Hemoglobinopathy evaluation - Obstetric Panel, Including HIV - Cystic Fibrosis Mutation 97 - US MFM OB COMP + 14 WK; Future - Prenat-FeAsp-Meth-FA-DHA w/o A (PRENATE PIXIE) 10-0.6-0.4-200 MG CAPS; Take 1 tablet by mouth daily.  Dispense: 30 capsule; Refill: 12 - AFP, Serum, Open Spina Bifida  2. Late prenatal care affecting pregnancy in first trimester     Started care at 20 weeks by dates - US MFM OB COMP + 14 WK; Future  3. Nausea and vomiting during pregnancy prior to [redacted] weeks gestation     Has tried small frequent meals and phenergan PO.   - Doxylamine-Pyridoxine ER (BONJESTA) 20-20 MG TBCR; Take 1 tablet by mouth 2 (two) times daily.  Dispense: 60 tablet; Refill: 6 - promethazine (PHENERGAN) 25 MG suppository; Place 1 suppository (25 mg total) rectally every 6 (six) hours as needed for nausea or vomiting.  Dispense: 12 each; Refill: 0  4. Hyperemesis gravidarum before end of [redacted] week gestation with dehydration     Resident notified by phone.  Attending in surgery.     Prenatal vitamins.  Counseling provided regarding continued use of seat belts, cessation of alcohol consumption, smoking or use of illicit drugs; infection precautions i.e., influenza/TDAP immunizations, toxoplasmosis,CMV, parvovirus, listeria and varicella; workplace safety, exercise during pregnancy; routine dental care, safe medications, sexual activity, hot tubs, saunas, pools, travel, caffeine use, fish and methlymercury, potential toxins, hair treatments, varicose veins Weight gain recommendations per IOM guidelines reviewed: underweight/BMI< 18.5--> gain 28 - 40 lbs; normal weight/BMI 18.5 - 24.9--> gain 25 - 35 lbs; overweight/BMI 25 - 29.9--> gain 15 - 25 lbs; obese/BMI >30->gain  11 - 20 lbs Problem list reviewed and updated. FIRST/CF mutation testing/NIPT/QUAD SCREEN/fragile X/Ashkenazi Jewish population testing/Spinal muscular atrophy discussed: ordered. Role of ultrasound in pregnancy discussed; fetal  survey: ordered. Amniocentesis discussed: not indicated..  Meds ordered this encounter  Medications  . acetaminophen (TYLENOL) 325 MG tablet    Sig: Take 650 mg by mouth every 6 (six) hours as needed.    Follow up in 4 weeks. 50% of 30min visit spent on counseling and coordination of care.

## 2017-01-08 NOTE — Discharge Instructions (Signed)
Hyperemesis Gravidarum Hyperemesis gravidarum is a severe form of nausea and vomiting that happens during pregnancy. Hyperemesis is worse than morning sickness. It may cause you to have nausea or vomiting all day for many days. It may keep you from eating and drinking enough food and liquids. Hyperemesis usually occurs during the first half (the first 20 weeks) of pregnancy. It often goes away once a woman is in her second half of pregnancy. However, sometimes hyperemesis continues through an entire pregnancy. What are the causes? The cause of this condition is not known. It may be related to changes in chemicals (hormones) in the body during pregnancy, such as the high level of pregnancy hormone (human chorionic gonadotropin) or the increase in the female sex hormone (estrogen). What are the signs or symptoms? Symptoms of this condition include:  Severe nausea and vomiting.  Nausea that does not go away.  Vomiting that does not allow you to keep any food down.  Weight loss.  Body fluid loss (dehydration).  Having no desire to eat, or not liking food that you have previously enjoyed. How is this diagnosed? This condition may be diagnosed based on:  A physical exam.  Your medical history.  Your symptoms.  Blood tests.  Urine tests. How is this treated? This condition may be managed with medicine. If medicines to do not help relieve nausea and vomiting, you may need to receive fluids through an IV tube at the hospital. Follow these instructions at home:  Take over-the-counter and prescription medicines only as told by your health care provider.  Avoid iron pills and multivitamins that contain iron for the first 3-4 months of pregnancy. If you take prescription iron pills, do not stop taking them unless your health care provider approves.  Take the following actions to help prevent nausea and vomiting:  In the morning, before getting out of bed, try eating a couple of dry  crackers or a piece of toast.  Avoid foods and smells that upset your stomach. Fatty and spicy foods may make nausea worse.  Eat 5-6 small meals a day.  Do not drink fluids while eating meals. Drink between meals.  Eat or suck on things that have ginger in them. Ginger can help relieve nausea.  Avoid food preparation. The smell of food can spoil your appetite or trigger nausea.  Follow instructions from your health care provider about eating or drinking restrictions.  For snacks, eat high-protein foods, such as cheese.  Keep all follow-up and pre-birth (prenatal) visits as told by your health care provider. This is important. Contact a health care provider if:  You have pain in your abdomen.  You have a severe headache.  You have vision problems.  You are losing weight. Get help right away if:  You cannot drink fluids without vomiting.  You vomit blood.  You have constant nausea and vomiting.  You are very weak.  You are very thirsty.  You feel dizzy.  You faint.  You have a fever or other symptoms that last for more than 2-3 days.  You have a fever and your symptoms suddenly get worse. Summary  Hyperemesis gravidarum is a severe form of nausea and vomiting that happens during pregnancy.  Making some changes to your eating habits may help relieve nausea and vomiting.  This condition may be managed with medicine.  If medicines to do not help relieve nausea and vomiting, you may need to receive fluids through an IV tube at the hospital. This information is  not intended to replace advice given to you by your health care provider. Make sure you discuss any questions you have with your health care provider. Document Released: 08/19/2005 Document Revised: 04/17/2016 Document Reviewed: 04/17/2016 Elsevier Interactive Patient Education  2017 [employer]  Pregnancy and Urinary Tract Infection What is a urinary tract infection? A urinary tract infection (UTI) is  an infection of any part of the urinary tract. This includes the kidneys, the tubes that connect your kidneys to your bladder (ureters), the bladder, and the tube that carries urine out of your body (urethra). These organs make, store, and get rid of urine in the body. A UTI can be a bladder infection (cystitis) or a kidney infection (pyelonephritis). This infection may be caused by fungi, viruses, and bacteria. Bacteria are the most common cause of UTIs. You are more likely to develop a UTI during pregnancy because:  The physical and hormonal changes your body goes through can make it easier for bacteria to get into your urinary tract.  Your growing baby puts pressure on your uterus and can affect urine flow. Does a UTI place my baby at risk? An untreated UTI during pregnancy could lead to a kidney infection, which can cause health problems that could affect your baby. Possible complications of an untreated UTI include:  Having your baby before 37 weeks of pregnancy (premature).  Having a baby with a low birth weight.  Developing high blood pressure during pregnancy (preeclampsia). What are the symptoms of a UTI? Symptoms of a UTI include:  Fever.  Frequent urination or passing small amounts of urine frequently.  Needing to urinate urgently.  Pain or a burning sensation with urination.  Urine that smells bad or unusual.  Cloudy urine.  Pain in the lower abdomen or back.  Trouble urinating.  Blood in the urine.  Vomiting or being less hungry than normal.  Diarrhea or abdominal pain.  Vaginal discharge. What are the treatment options for a UTI during pregnancy? Treatment for this condition may include:  Antibiotic medicines that are safe to take during pregnancy.  Other medicines to treat less common causes of UTI. How can I prevent a UTI?   To prevent a UTI:  Go to the bathroom as soon as you feel the need.  Always wipe from front to back.  Wash your genital area  with soap and warm water daily.  Empty your bladder before and after sex.  Wear cotton underwear.  Limit your intake of high sugar foods or drinks, such as regular soda, juice, and sweets.  Drink 6-8 glasses of water daily.  Do not wear tight-fitting pants.  Do not douche or use deodorant sprays.  Do not drink alcohol, caffeine, or carbonated drinks. These can irritate the bladder. Contact a health care provider if:  Your symptoms do not improve or get worse.  You have a fever after two days of treatment.  You have a rash.  You have abnormal vaginal discharge.  You have back or side pain.  You have chills.  You have nausea and vomiting. Get help right away if: Seek immediate medical care if you are pregnant and:  You feel contractions in your uterus.  You have lower belly pain.  You have a gush of fluid from your vagina.  You have blood in your urine.  You are vomiting and cannot keep down any medicines or water. This information is not intended to replace advice given to you by your health care provider. Make sure  you discuss any questions you have with your health care provider. Document Released: 12/14/2010 Document Revised: 08/02/2016 Document Reviewed: 07/10/2015 Elsevier Interactive Patient Education  2017 [employer].

## 2017-01-08 NOTE — Progress Notes (Signed)
Patient is in the office for initial ob visit, complains of pressure, denies contractions and bleeding. Pt reports good fetal movement.

## 2017-01-08 NOTE — MAU Provider Note (Signed)
History     CSN: [id number]  Arrival date and time: 01/08/17 1103   First Provider Initiated Contact with Patient 01/08/17 1415      Chief Complaint  Patient presents with  . Emesis During Pregnancy   HPI   [name] is a 21 y.o. female G1P0000 @ 836w4d here in MAU with N/V. Says she has vomited twice in the last 24 hours. Patient is eating a twix bar now in the MAU and says that is the only thing she eaten today.  Says she has not gained any weight. Says she is not taking medication for the symptoms.   OB History    Gravida Para Term Preterm AB Living   1 0 0 0 0 0   SAB TAB Ectopic Multiple Live Births   0 0 0 0 0      Past Medical History:  Diagnosis Date  . Medical history non-contributory     Past Surgical History:  Procedure Laterality Date  . NO PAST SURGERIES      Family History  Problem Relation Age of Onset  . Breast cancer Maternal Aunt   . Mental retardation Brother   . Asperger's syndrome Brother     Social History  Substance Use Topics  . Smoking status: Never Smoker  . Smokeless tobacco: Never Used  . Alcohol use No    Allergies: No Known Allergies  Prescriptions Prior to Admission  Medication Sig Dispense Refill Last Dose  . acetaminophen (TYLENOL) 325 MG tablet Take 650 mg by mouth every 6 (six) hours as needed.   Taking  . Doxylamine-Pyridoxine ER (BONJESTA) 20-20 MG TBCR Take 1 tablet by mouth 2 (two) times daily. 60 tablet 6   . Prenat-FeAsp-Meth-FA-DHA w/o A (PRENATE PIXIE) 10-0.6-0.4-200 MG CAPS Take 1 tablet by mouth daily. 30 capsule 12   . promethazine (PHENERGAN) 25 MG suppository Place 1 suppository (25 mg total) rectally every 6 (six) hours as needed for nausea or vomiting. 12 each 0   . promethazine (PHENERGAN) 25 MG tablet Take 1 tablet (25 mg total) by mouth every 6 (six) hours as needed. 30 tablet 1    Results for orders placed or performed during the hospital encounter of 01/08/17 (from the past 48 hour(s))   Urinalysis, Routine w reflex microscopic     Status: Abnormal   Collection Time: 01/08/17 12:30 PM  Result Value Ref Range   Color, Urine YELLOW YELLOW   APPearance HAZY (A) CLEAR   Specific Gravity, Urine 1.026 1.005 - 1.030   pH 6.0 5.0 - 8.0   Glucose, UA NEGATIVE NEGATIVE mg/dL   Hgb urine dipstick SMALL (A) NEGATIVE   Bilirubin Urine NEGATIVE NEGATIVE   Ketones, ur 20 (A) NEGATIVE mg/dL   Protein, ur 30 (A) NEGATIVE mg/dL   Nitrite NEGATIVE NEGATIVE   Leukocytes, UA SMALL (A) NEGATIVE   RBC / HPF 0-5 0 - 5 RBC/hpf   WBC, UA 6-30 0 - 5 WBC/hpf   Bacteria, UA MANY (A) NONE SEEN   Squamous Epithelial / LPF 6-30 (A) NONE SEEN   Mucous PRESENT     Review of Systems  Gastrointestinal: Positive for nausea and vomiting.  Genitourinary: Positive for dysuria and urgency. Negative for frequency.   Physical Exam   Blood pressure 110/62, pulse 81, temperature 98.7 F (37.1 C), temperature source Oral, resp. rate 16, weight 127 lb 12 oz (57.9 kg), last menstrual period 08/17/2016, SpO2 100 %.  Physical Exam  Constitutional: She is oriented to person, place,  and time. She appears well-developed and well-nourished. No distress.  HENT:  Head: Normocephalic.  Respiratory: Effort normal.  Musculoskeletal: Normal range of motion.  Neurological: She is alert and oriented to person, place, and time.  Skin: Skin is warm. She is not diaphoretic.  Psychiatric: Her behavior is normal.   MAU Course  Procedures  None  MDM  Urine culture  D5LR bolus Zofran 8 mg IV Pepcid 40 mg Iv.  + fetal heart tones via doppler   Assessment and Plan    A:  1. Hyperemesis gravidarum before end of [redacted] week gestation with dehydration   2. UTI in pregnancy, second trimester     P:  Discharge home in stable condition Continue ranitidine  Patient has Rx for phenergan and zofran; encouraged to use Return to MAU if symptoms worsen Small, frequent meals Increase PO fluid Rx; Keflex  Ilse Billman,  Harolyn Rutherford, NP 01/08/2017 4:02 PM

## 2017-01-08 NOTE — Progress Notes (Signed)
Resident notified of hyperemesis and the need for hydration, late prenatal care and history.

## 2017-01-08 NOTE — MAU Note (Signed)
Was sent by dr's office, not keeping enough foods down.  Needs fluids. Was given an rx for antiemetics. (not actively vomiting while in the lobby)

## 2017-01-08 NOTE — Addendum Note (Signed)
Addended by: Samantha CrimesENNEY, Shaconda Hajduk ANNE on: 01/08/2017 03:56 PM   Modules accepted: Orders

## 2017-01-09 LAB — CULTURE, OB URINE: Special Requests: NORMAL

## 2017-01-09 LAB — CERVICOVAGINAL ANCILLARY ONLY
Bacterial vaginitis: NEGATIVE
Candida vaginitis: NEGATIVE
Chlamydia: NEGATIVE
Neisseria Gonorrhea: NEGATIVE
Trichomonas: NEGATIVE

## 2017-01-10 LAB — CULTURE, OB URINE

## 2017-01-10 LAB — URINE CULTURE, OB REFLEX

## 2017-01-11 LAB — AFP, SERUM, OPEN SPINA BIFIDA
AFP MOM: 1.2
AFP Value: 89.3 ng/mL
Gest. Age on Collection Date: 20.6 weeks
Maternal Age At EDD: 22.6 yr
OSBR Risk 1 IN: 10000
TEST RESULTS AFP: NEGATIVE
Weight: 127 [lb_av]

## 2017-01-12 ENCOUNTER — Other Ambulatory Visit: Payer: Self-pay | Admitting: Certified Nurse Midwife

## 2017-01-12 DIAGNOSIS — R87612 Low grade squamous intraepithelial lesion on cytologic smear of cervix (LGSIL): Secondary | ICD-10-CM | POA: Insufficient documentation

## 2017-01-12 DIAGNOSIS — Z3400 Encounter for supervision of normal first pregnancy, unspecified trimester: Secondary | ICD-10-CM

## 2017-01-12 LAB — CYTOLOGY - PAP

## 2017-01-14 ENCOUNTER — Other Ambulatory Visit: Payer: Self-pay | Admitting: Certified Nurse Midwife

## 2017-01-14 LAB — MATERNIT21 PLUS CORE+SCA
Chromosome 13: NEGATIVE
Chromosome 18: NEGATIVE
Chromosome 21: NEGATIVE
Y Chromosome: DETECTED

## 2017-01-15 LAB — HEMOGLOBINOPATHY EVALUATION
HEMOGLOBIN A2 QUANTITATION: 2.5 % (ref 1.8–3.2)
HGB C: 0 %
HGB S: 0 %
HGB VARIANT: 0 %
Hemoglobin F Quantitation: 0 % (ref 0.0–2.0)
Hgb A: 97.5 % (ref 96.4–98.8)

## 2017-01-15 LAB — OBSTETRIC PANEL, INCLUDING HIV
Antibody Screen: NEGATIVE
Basophils Absolute: 0 10*3/uL (ref 0.0–0.2)
Basos: 0 %
EOS (ABSOLUTE): 0.4 10*3/uL (ref 0.0–0.4)
EOS: 6 %
HEMOGLOBIN: 10.3 g/dL — AB (ref 11.1–15.9)
HEP B S AG: NEGATIVE
HIV Screen 4th Generation wRfx: NONREACTIVE
Hematocrit: 30.5 % — ABNORMAL LOW (ref 34.0–46.6)
IMMATURE GRANS (ABS): 0 10*3/uL (ref 0.0–0.1)
IMMATURE GRANULOCYTES: 0 %
LYMPHS: 19 %
Lymphocytes Absolute: 1.3 10*3/uL (ref 0.7–3.1)
MCH: 27.4 pg (ref 26.6–33.0)
MCHC: 33.8 g/dL (ref 31.5–35.7)
MCV: 81 fL (ref 79–97)
MONOS ABS: 0.5 10*3/uL (ref 0.1–0.9)
Monocytes: 8 %
NEUTROS PCT: 67 %
Neutrophils Absolute: 4.5 10*3/uL (ref 1.4–7.0)
Platelets: 243 10*3/uL (ref 150–379)
RBC: 3.76 x10E6/uL — AB (ref 3.77–5.28)
RDW: 18 % — ABNORMAL HIGH (ref 12.3–15.4)
RPR: NONREACTIVE
RUBELLA: 15.9 {index} (ref >0.99)
Rh Factor: POSITIVE
WBC: 6.7 10*3/uL (ref 3.4–10.8)

## 2017-01-15 LAB — CYSTIC FIBROSIS MUTATION 97

## 2017-01-15 LAB — VITAMIN D 25 HYDROXY (VIT D DEFICIENCY, FRACTURES): Vit D, 25-Hydroxy: 12.6 ng/mL — ABNORMAL LOW (ref 30.0–100.0)

## 2017-01-15 LAB — HEMOGLOBIN A1C
Est. average glucose Bld gHb Est-mCnc: 100 mg/dL
Hgb A1c MFr Bld: 5.1 % (ref 4.8–5.6)

## 2017-01-15 LAB — VARICELLA ZOSTER ANTIBODY, IGG: Varicella zoster IgG: 920 index (ref >165)

## 2017-01-16 ENCOUNTER — Other Ambulatory Visit: Payer: Self-pay | Admitting: Certified Nurse Midwife

## 2017-01-16 ENCOUNTER — Ambulatory Visit (HOSPITAL_COMMUNITY)
Admission: RE | Admit: 2017-01-16 | Discharge: 2017-01-16 | Disposition: A | Discharge status: Home / Self Care | Payer: Medicaid Other | Source: Ambulatory Visit | Attending: Certified Nurse Midwife | Admitting: Certified Nurse Midwife

## 2017-01-16 DIAGNOSIS — Z141 Cystic fibrosis carrier: Secondary | ICD-10-CM | POA: Insufficient documentation

## 2017-01-16 DIAGNOSIS — Z3A21 21 weeks gestation of pregnancy: Secondary | ICD-10-CM | POA: Diagnosis not present

## 2017-01-16 DIAGNOSIS — O0932 Supervision of pregnancy with insufficient antenatal care, second trimester: Secondary | ICD-10-CM | POA: Insufficient documentation

## 2017-01-16 DIAGNOSIS — O0931 Supervision of pregnancy with insufficient antenatal care, first trimester: Secondary | ICD-10-CM

## 2017-01-16 DIAGNOSIS — R7989 Other specified abnormal findings of blood chemistry: Secondary | ICD-10-CM | POA: Insufficient documentation

## 2017-01-16 DIAGNOSIS — Z3400 Encounter for supervision of normal first pregnancy, unspecified trimester: Secondary | ICD-10-CM

## 2017-01-16 MED ORDER — VITAMIN D (ERGOCALCIFEROL) 1.25 MG (50000 UNIT) PO CAPS
50000.0000 [IU] | ORAL_CAPSULE | ORAL | 2 refills | Status: DC
Start: 2017-01-16 — End: 2017-03-03

## 2017-01-20 ENCOUNTER — Other Ambulatory Visit: Payer: Self-pay | Admitting: Certified Nurse Midwife

## 2017-01-20 DIAGNOSIS — Z3400 Encounter for supervision of normal first pregnancy, unspecified trimester: Secondary | ICD-10-CM

## 2017-01-22 ENCOUNTER — Telehealth: Payer: Self-pay

## 2017-01-22 NOTE — Telephone Encounter (Signed)
Pt called to confirm next appt. Pt informed that next appt is 6/6

## 2017-01-29 ENCOUNTER — Encounter: Payer: Self-pay | Admitting: *Deleted

## 2017-02-05 ENCOUNTER — Ambulatory Visit (INDEPENDENT_AMBULATORY_CARE_PROVIDER_SITE_OTHER): Payer: Medicaid Other | Admitting: Certified Nurse Midwife

## 2017-02-05 ENCOUNTER — Ambulatory Visit (HOSPITAL_COMMUNITY)
Admission: RE | Admit: 2017-02-05 | Discharge: 2017-02-05 | Disposition: A | Discharge status: Home / Self Care | Payer: Medicaid Other | Source: Ambulatory Visit | Attending: Certified Nurse Midwife | Admitting: Certified Nurse Midwife

## 2017-02-05 VITALS — BP 122/79 | HR 110 | Wt 138.1 lb

## 2017-02-05 DIAGNOSIS — O289 Unspecified abnormal findings on antenatal screening of mother: Secondary | ICD-10-CM

## 2017-02-05 DIAGNOSIS — O99612 Diseases of the digestive system complicating pregnancy, second trimester: Secondary | ICD-10-CM

## 2017-02-05 DIAGNOSIS — O219 Vomiting of pregnancy, unspecified: Secondary | ICD-10-CM

## 2017-02-05 DIAGNOSIS — B3731 Acute candidiasis of vulva and vagina: Secondary | ICD-10-CM

## 2017-02-05 DIAGNOSIS — Z3A24 24 weeks gestation of pregnancy: Secondary | ICD-10-CM

## 2017-02-05 DIAGNOSIS — Z3482 Encounter for supervision of other normal pregnancy, second trimester: Secondary | ICD-10-CM

## 2017-02-05 DIAGNOSIS — R7989 Other specified abnormal findings of blood chemistry: Secondary | ICD-10-CM

## 2017-02-05 DIAGNOSIS — Z141 Cystic fibrosis carrier: Secondary | ICD-10-CM

## 2017-02-05 DIAGNOSIS — Z3400 Encounter for supervision of normal first pregnancy, unspecified trimester: Secondary | ICD-10-CM

## 2017-02-05 DIAGNOSIS — O352XX0 Maternal care for (suspected) hereditary disease in fetus, not applicable or unspecified: Secondary | ICD-10-CM

## 2017-02-05 DIAGNOSIS — B373 Candidiasis of vulva and vagina: Secondary | ICD-10-CM

## 2017-02-05 DIAGNOSIS — K219 Gastro-esophageal reflux disease without esophagitis: Secondary | ICD-10-CM

## 2017-02-05 DIAGNOSIS — R87612 Low grade squamous intraepithelial lesion on cytologic smear of cervix (LGSIL): Secondary | ICD-10-CM

## 2017-02-05 DIAGNOSIS — E559 Vitamin D deficiency, unspecified: Secondary | ICD-10-CM

## 2017-02-05 MED ORDER — OMEPRAZOLE 20 MG PO CPDR: 20.0000 mg | DELAYED_RELEASE_CAPSULE | Freq: Two times a day (BID) | ORAL | 5 refills | Status: DC

## 2017-02-05 MED ORDER — TERCONAZOLE 0.8 % VA CREA: 1.0000 | TOPICAL_CREAM | Freq: Every day | VAGINAL | 0 refills | Status: DC

## 2017-02-05 MED ORDER — FLUCONAZOLE 150 MG PO TABS: 150.0000 mg | ORAL_TABLET | Freq: Once | ORAL | 0 refills | Status: AC

## 2017-02-05 NOTE — Progress Notes (Signed)
   PRENATAL VISIT NOTE  Subjective:  Kelsey Hanson is a 21 y.o. G1P0000 at 6842w4d being seen today for ongoing prenatal care.  She is currently monitored for the following issues for this low-risk pregnancy and has Supervision of normal first pregnancy, antepartum; Late prenatal care affecting pregnancy in first trimester; Nausea and vomiting during pregnancy prior to [redacted] weeks gestation; Hyperemesis gravidarum before end of [redacted] week gestation with dehydration; LGSIL on Pap smear of cervix; Low vitamin D level; and Cystic fibrosis gene carrier on her problem list.  Patient reports no bleeding, no contractions, no cramping, no leaking and vaginal irritation.  Contractions: Not present. Vag. Bleeding: None.  Movement: Present. Denies leaking of fluid.   The following portions of the patient's history were reviewed and updated as appropriate: allergies, current medications, past family history, past medical history, past social history, past surgical history and problem list. Problem list updated.  Objective:   Vitals:   02/05/17 1317  BP: 122/79  Pulse: (!) 110  Weight: 138 lb 1.6 oz (62.6 kg)    Fetal Status: Fetal Heart Rate (bpm): 135 Fundal Height: 24 cm Movement: Present     General:  Alert, oriented and cooperative. Patient is in no acute distress.  Skin: Skin is warm and dry. No rash noted.   Cardiovascular: Normal heart rate noted  Respiratory: Normal respiratory effort, no problems with respiration noted  Abdomen: Soft, gravid, appropriate for gestational age. Pain/Pressure: Absent     Pelvic:  Cervical exam deferred        Extremities: Normal range of motion.  Edema: None  Mental Status: Normal mood and affect. Normal behavior. Normal judgment and thought content.   Assessment and Plan:  Pregnancy: G1P0000 at 5342w4d  1. Supervision of normal first pregnancy, antepartum      TOC next ROB for UTI.  2. Nausea and vomiting during pregnancy prior to [redacted] weeks gestation  Taking Zofran as needed  3. LGSIL on Pap smear of cervix     Colpo postpartum  4. Low vitamin D level     Taking weekly vitamin D  5. Cystic fibrosis gene carrier      Has completed genetics counseling.   6. Gastroesophageal reflux during pregnancy, antepartum, second trimester      Has tried TUMS and Zantac. - omeprazole (PRILOSEC) 20 MG capsule; Take 1 capsule (20 mg total) by mouth 2 (two) times daily before a meal.  Dispense: 60 capsule; Refill: 5  7. Yeast vaginitis       - fluconazole (DIFLUCAN) 150 MG tablet; Take 1 tablet (150 mg total) by mouth once.  Dispense: 1 tablet; Refill: 0 - terconazole (TERAZOL 3) 0.8 % vaginal cream; Place 1 applicator vaginally at bedtime.  Dispense: 20 g; Refill: 0  Preterm labor symptoms and general obstetric precautions including but not limited to vaginal bleeding, contractions, leaking of fluid and fetal movement were reviewed in detail with the patient. Please refer to After Visit Summary for other counseling recommendations.  Return in about 4 weeks (around 03/05/2017) for ROB, 2 hr OGTT.   Roe Coombsachelle A Machell Wirthlin, CNM

## 2017-02-05 NOTE — Progress Notes (Signed)
Patient reports good fetal movement, denies pain. Pt states that she continues to have nausea and heartburn, and would like rx sent.

## 2017-02-05 NOTE — Progress Notes (Signed)
Genetic Counseling  Visit Summary Note  Appointment Date: 02/05/2017 Referred By: Morene Crocker, CNM  Date of Birth: [birth year]  Pregnancy history: G1P0000 Estimated Date of Delivery: 05/24/17 Estimated Gestational Age: [redacted]w[redacted]d I met with Ms. Kelsey Scotlandfor genetic counseling because routine cystic fibrosis carrier screening identified Ms. Kelsey Scotlandas a carrier for cystic fibrosis (CF).    In summary:  Discussed cystic fibrosis and autosomal recessive inheritance  Discussed availability of carrier screening for the father of the baby  Declined as he was not present  Reviewed risks to the fetus prior to carrier screening for the father of the baby  Discussed option of prenatal diagnosis only when mutations have been identified in both parents  Declined amniocentesis at this time  Understand limitation of ultrasound in diagnosis of cf  Other family history concerns discussed  We reviewed the results of Ms. Kelsey Hanson's CF carrier screening.  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 CF carrier screening has not yet been performed for the father of the baby.  Ms. ISteevesreported no known relatives with cystic fibrosis and denied consanguinity with the father of the baby, Kelsey Hanson    We discussed that classic features of CF include thickened secretions in the lungs, digestive and reproductive systems. This life-limiting condition is characterized by chronic respiratory infections requiring daily chest therapies and pancreatic dysfunction disrupting the body's ability to break down food and extract nutrients as it should, which may restrict growth. Infertility commonly occurs in males. With therapies, such as daily respiratory therapies and medications to aid digestion, the median lifespan for people with CF is increasing.  Treatment may involve lung transplantation in some cases. There can be significant variability in the  severity of symptoms and expression of the disease. Expression of the disease depends upon the specific mutations present in an individual with CF.   We spent time reviewing the autosomal recessive inheritance of CF. CF is a common genetic condition in the Caucasian population occurring in approximately 1 in 344,300Caucasian births. This means approximately 1 in 29 Caucasians is a CF carrier. We discussed that individuals who are carriers have one copy of the CFTR gene with a disease causing mutation, and their other CFTR gene copy functions correctly. Thus, carriers typically do not have associated medical symptoms. We discussed that when both parents are carriers for CF, each pregnancy has an independent chance for one of the following outcomes: a 25% chance to inherit both mutations and thus have CF; a 50% chance to inherit one gene mutation and be a carrier similar to parents; and a 25% chance to be neither a carrier nor have CF. When one parent is a CF carrier but the other is not, then each pregnancy has a 1 in 2 chance to be a CF carrier but would not be expected to be at increased risk to inherit CF.   There are known to be thousands of mutations which can cause the CFTR gene to not function properly. Carrier screening does not identify all CF carriers. Thus, a negative CF carrier screen would reduce, but not eliminate, the chance to be a CF carrier and thus the chance for CF in a pregnancy.    We reviewed that when both parents are identified to be CF carriers, prenatal diagnosis via amniocentesis would be available, if desired. The risks, benefits, and limitations of amniocentesis were reviewed. A fetus with cystic fibrosis typically appears normal on targeted  ultrasound, although rarely echogenic bowel is visualized. However, the presence of echogenic bowel on targeted ultrasound is not diagnostic for CF in a pregnancy, nor does the absence of echogenic bowel on ultrasound rule out CF in the  pregnancy. We discussed that postnatal testing for CF can also be performed for babies identified to be at risk to inherit CF. She understands that in [place], the newborn screening test will detect CF, but that carriers may come back as false positives.  As the father of the baby was not present today and Ms. Kelsey Hanson was not concerned about the risk for CF in her pregnancy, carrier screening was not performed for him at this time.    Ms. Kelsey Hanson mother reported that her youngest daughter was found on newborn screen to "have cystic fibrosis".  However, that child is healthy other than having nasal congestion that results in her frequently breathing through her mouth.  Ms. Kelsey Hanson mother also reported that she and her husband had testing after those newborn screening results, and were both found to be carriers for cystic fibrosis.  We discussed that her unaffected children would each have a 2 in 3 chance to be a carrier for CF.  Without reviewing those test results, it is unclear what significance, if any, this has for Ms. Kelsey Hanson.  Ms. Kelsey Hanson mother reported that she has 4 sons, two of whom have asperger syndrome.  We discussed that asperger syndrome is part of the spectrum of conditions referred to as Autistic spectrum disorders (ASD). We discussed that ASDs are among the most common neurodevelopmental disorders, with approximately 1 in 68 children meeting criteria for ASD, according to the Centers for Disease Control. Approximately 80% of individuals diagnosed are female. There is strong evidence that genetic factors play a critical role in development of ASD. There have been recent advances in identifying specific genetic causes of ASD, however, there are still many individuals for whom the etiology of the ASD is not known. The majority of individuals with ASD (70-80%) have essential autism. There is strong evidence that genetic factors play a critical role in development of ASD. Some  individuals with ASDs are found to have causative differences in karyotype analysis, chromosomal microarray analysis, or single genes. These are more likely to be identified in individuals with complex autism spectrum disorders.    They understand that at this time there is not genetic testing available for ASD for most families. In the case of an identified genetic cause, recurrence risk estimates may be provided. The patient is aware that for many individuals with autism spectrum disorders an underlying genetic cause is not identified. In the absence of an identified genetic etiology, prenatal screening or testing would not be available in the current pregnancy.   Ms. Kelsey Hanson mother reported that one of her sons has multiple congenital anomalies and health concerns including a chiari malformation, syringomyelia, glaucoma, Pfapa, intellectual disabilities, failure to thrive, hydrocephalus and GI dysfunction.  She reports that while he has been seen by a neurosurgeon, he was unable to have genetic testing due to their insurance coverage at the time.  Without a specific diagnosis for his condition, an accurate recurrence chance cannot be provided.  We briefly reviewed the heritability of X linked disorders, given that all 4 of her brothers have health and/or intellectual differences.  But as they do not all have the same features, it is unclear if there is a similar etiology.  Ms. Kelsey Hanson expressed that she was not concerned about the  recurrence chances.  Both family histories were reviewed and were otherwise negative for birth defects, mental retardation, and known genetic conditions. Without further information regarding the provided family history, an accurate genetic risk cannot be calculated. Further genetic counseling is warranted if more information is obtained.  Ms. Kelsey Hanson denied exposure to environmental toxins or chemical agents. She denied the use of alcohol, tobacco or street drugs. She  denied significant viral illnesses during the course of her pregnancy. Her medical and surgical histories were noncontributory.   I counseled Ms. Kelsey Hanson regarding the above risks and available options.  The approximate face-to-face time with the genetic counselor was 60 minutes.  Cam Hai, MS Certified Genetic Counselor

## 2017-03-03 ENCOUNTER — Other Ambulatory Visit: Payer: Medicaid Other

## 2017-03-03 ENCOUNTER — Ambulatory Visit (INDEPENDENT_AMBULATORY_CARE_PROVIDER_SITE_OTHER): Payer: Medicaid Other | Admitting: Certified Nurse Midwife

## 2017-03-03 VITALS — BP 117/77 | HR 92 | Wt 139.5 lb

## 2017-03-03 DIAGNOSIS — O219 Vomiting of pregnancy, unspecified: Secondary | ICD-10-CM

## 2017-03-03 DIAGNOSIS — R7989 Other specified abnormal findings of blood chemistry: Secondary | ICD-10-CM

## 2017-03-03 DIAGNOSIS — Z87440 Personal history of urinary (tract) infections: Secondary | ICD-10-CM

## 2017-03-03 DIAGNOSIS — Z3400 Encounter for supervision of normal first pregnancy, unspecified trimester: Secondary | ICD-10-CM

## 2017-03-03 DIAGNOSIS — E559 Vitamin D deficiency, unspecified: Secondary | ICD-10-CM

## 2017-03-03 DIAGNOSIS — Z3403 Encounter for supervision of normal first pregnancy, third trimester: Secondary | ICD-10-CM

## 2017-03-03 MED ORDER — PROMETHAZINE HCL 12.5 MG PO TABS: 12.5000 mg | ORAL_TABLET | Freq: Four times a day (QID) | ORAL | 0 refills | Status: DC | PRN

## 2017-03-03 MED ORDER — VITAMIN D (ERGOCALCIFEROL) 1.25 MG (50000 UNIT) PO CAPS: 50000.0000 [IU] | ORAL_CAPSULE | ORAL | 2 refills | Status: DC

## 2017-03-03 MED ORDER — DOXYLAMINE-PYRIDOXINE 10-10 MG PO TBEC: DELAYED_RELEASE_TABLET | ORAL | 4 refills | Status: DC

## 2017-03-03 NOTE — Progress Notes (Signed)
   PRENATAL VISIT NOTE  Subjective:  Kelsey Hanson is a 21 y.o. G1P0000 at 2651w2d being seen today for ongoing prenatal care.  She is currently monitored for the following issues for this low-risk pregnancy and has Supervision of normal first pregnancy, antepartum; Late prenatal care affecting pregnancy in first trimester; Nausea and vomiting during pregnancy prior to [redacted] weeks gestation; Hyperemesis gravidarum before end of [redacted] week gestation with dehydration; LGSIL on Pap smear of cervix; Low vitamin D level; and Cystic fibrosis gene carrier on her problem list.  Patient reports nausea, no bleeding, no contractions, no cramping, no leaking and vomiting.  Contractions: Not present. Vag. Bleeding: None.  Movement: Present. Denies leaking of fluid.   The following portions of the patient's history were reviewed and updated as appropriate: allergies, current medications, past family history, past medical history, past social history, past surgical history and problem list. Problem list updated.  Objective:   Vitals:   03/03/17 0923  BP: 117/77  Pulse: 92  Weight: 139 lb 8 oz (63.3 kg)    Fetal Status: Fetal Heart Rate (bpm): 142 Fundal Height: 26 cm Movement: Present     General:  Alert, oriented and cooperative. Patient is in no acute distress.  Skin: Skin is warm and dry. No rash noted.   Cardiovascular: Normal heart rate noted  Respiratory: Normal respiratory effort, no problems with respiration noted  Abdomen: Soft, gravid, appropriate for gestational age. Pain/Pressure: Present     Pelvic:  Cervical exam deferred        Extremities: Normal range of motion.  Edema: None  Mental Status: Normal mood and affect. Normal behavior. Normal judgment and thought content.   Assessment and Plan:  Pregnancy: G1P0000 at 10151w2d  1. Supervision of normal first pregnancy, antepartum     - Glucose Tolerance, 2 Hours w/1 Hour - CBC - HIV antibody (with reflex) - RPR  2. Low vitamin D level   - Vitamin D, Ergocalciferol, (DRISDOL) 50000 units CAPS capsule; Take 1 capsule (50,000 Units total) by mouth every 7 (seven) days.  Dispense: 30 capsule; Refill: 2  3. Nausea and vomiting in pregnancy      - Doxylamine-Pyridoxine (DICLEGIS) 10-10 MG TBEC; Take 1 tablet with breakfast and lunch.  Take 2 tablets at bedtime.  Dispense: 100 tablet; Refill: 4 - promethazine (PHENERGAN) 12.5 MG tablet; Take 1 tablet (12.5 mg total) by mouth every 6 (six) hours as needed for nausea or vomiting.  Dispense: 30 tablet; Refill: 0  4. History of UTI     TOC today - Culture, OB Urine  Preterm labor symptoms and general obstetric precautions including but not limited to vaginal bleeding, contractions, leaking of fluid and fetal movement were reviewed in detail with the patient. Please refer to After Visit Summary for other counseling recommendations.  No Follow-up on file.   Roe Coombsachelle A Malakai Schoenherr, CNM

## 2017-03-03 NOTE — Progress Notes (Signed)
Patient need refills on Vit. D.  Rx for nausea to be taken at night.

## 2017-03-04 ENCOUNTER — Other Ambulatory Visit: Payer: Self-pay | Admitting: Certified Nurse Midwife

## 2017-03-04 DIAGNOSIS — O99013 Anemia complicating pregnancy, third trimester: Secondary | ICD-10-CM

## 2017-03-04 DIAGNOSIS — Z3400 Encounter for supervision of normal first pregnancy, unspecified trimester: Secondary | ICD-10-CM

## 2017-03-04 LAB — CBC
HEMATOCRIT: 32 % — AB (ref 34.0–46.6)
Hemoglobin: 10.2 g/dL — ABNORMAL LOW (ref 11.1–15.9)
MCH: 28 pg (ref 26.6–33.0)
MCHC: 31.9 g/dL (ref 31.5–35.7)
MCV: 88 fL (ref 79–97)
PLATELETS: 206 10*3/uL (ref 150–379)
RBC: 3.64 x10E6/uL — AB (ref 3.77–5.28)
RDW: 16.1 % — AB (ref 12.3–15.4)
WBC: 7.2 10*3/uL (ref 3.4–10.8)

## 2017-03-04 LAB — GLUCOSE TOLERANCE, 2 HOURS W/ 1HR
GLUCOSE, 2 HOUR: 85 mg/dL (ref 65–152)
Glucose, 1 hour: 86 mg/dL (ref 65–179)
Glucose, Fasting: 73 mg/dL (ref 65–91)

## 2017-03-04 LAB — HIV ANTIBODY (ROUTINE TESTING W REFLEX): HIV Screen 4th Generation wRfx: NONREACTIVE

## 2017-03-04 LAB — RPR: RPR: NONREACTIVE

## 2017-03-04 MED ORDER — CITRANATAL BLOOM 90-1 MG PO TABS: 1.0000 | ORAL_TABLET | Freq: Every day | ORAL | 12 refills | Status: DC

## 2017-03-05 LAB — URINE CULTURE, OB REFLEX

## 2017-03-05 LAB — CULTURE, OB URINE

## 2017-03-06 ENCOUNTER — Other Ambulatory Visit: Payer: Self-pay | Admitting: Certified Nurse Midwife

## 2017-03-06 DIAGNOSIS — O219 Vomiting of pregnancy, unspecified: Secondary | ICD-10-CM

## 2017-03-10 MED ORDER — PROMETHAZINE HCL 25 MG RE SUPP: 25.0000 mg | Freq: Four times a day (QID) | RECTAL | 99 refills | Status: DC | PRN

## 2017-03-19 ENCOUNTER — Inpatient Hospital Stay (HOSPITAL_COMMUNITY): Payer: Medicaid Other

## 2017-03-19 ENCOUNTER — Other Ambulatory Visit (HOSPITAL_COMMUNITY)
Admission: RE | Admit: 2017-03-19 | Discharge: 2017-03-19 | Disposition: A | Discharge status: Home / Self Care | Payer: Medicaid Other | Source: Ambulatory Visit | Attending: Certified Nurse Midwife | Admitting: Certified Nurse Midwife

## 2017-03-19 ENCOUNTER — Inpatient Hospital Stay (HOSPITAL_COMMUNITY)
Admission: AD | Admit: 2017-03-19 | Discharge: 2017-03-19 | Disposition: A | Discharge status: Home / Self Care | Payer: Medicaid Other | Source: Ambulatory Visit | Attending: Obstetrics and Gynecology | Admitting: Obstetrics and Gynecology

## 2017-03-19 ENCOUNTER — Encounter (HOSPITAL_COMMUNITY): Payer: Self-pay | Admitting: *Deleted

## 2017-03-19 ENCOUNTER — Ambulatory Visit (INDEPENDENT_AMBULATORY_CARE_PROVIDER_SITE_OTHER): Payer: Medicaid Other | Admitting: Certified Nurse Midwife

## 2017-03-19 VITALS — BP 128/78 | HR 90 | Wt 142.0 lb

## 2017-03-19 DIAGNOSIS — O0933 Supervision of pregnancy with insufficient antenatal care, third trimester: Secondary | ICD-10-CM | POA: Insufficient documentation

## 2017-03-19 DIAGNOSIS — Z3A30 30 weeks gestation of pregnancy: Secondary | ICD-10-CM | POA: Insufficient documentation

## 2017-03-19 DIAGNOSIS — Z3403 Encounter for supervision of normal first pregnancy, third trimester: Secondary | ICD-10-CM | POA: Diagnosis not present

## 2017-03-19 DIAGNOSIS — O99013 Anemia complicating pregnancy, third trimester: Secondary | ICD-10-CM

## 2017-03-19 DIAGNOSIS — Z141 Cystic fibrosis carrier: Secondary | ICD-10-CM | POA: Diagnosis not present

## 2017-03-19 DIAGNOSIS — D649 Anemia, unspecified: Secondary | ICD-10-CM | POA: Diagnosis not present

## 2017-03-19 DIAGNOSIS — R7989 Other specified abnormal findings of blood chemistry: Secondary | ICD-10-CM

## 2017-03-19 DIAGNOSIS — O26893 Other specified pregnancy related conditions, third trimester: Secondary | ICD-10-CM | POA: Diagnosis not present

## 2017-03-19 DIAGNOSIS — O212 Late vomiting of pregnancy: Secondary | ICD-10-CM | POA: Diagnosis not present

## 2017-03-19 DIAGNOSIS — Z23 Encounter for immunization: Secondary | ICD-10-CM

## 2017-03-19 DIAGNOSIS — N898 Other specified noninflammatory disorders of vagina: Secondary | ICD-10-CM | POA: Diagnosis not present

## 2017-03-19 DIAGNOSIS — O4290 Premature rupture of membranes, unspecified as to length of time between rupture and onset of labor, unspecified weeks of gestation: Secondary | ICD-10-CM

## 2017-03-19 DIAGNOSIS — E559 Vitamin D deficiency, unspecified: Secondary | ICD-10-CM

## 2017-03-19 DIAGNOSIS — Z3400 Encounter for supervision of normal first pregnancy, unspecified trimester: Secondary | ICD-10-CM

## 2017-03-19 LAB — AMNISURE RUPTURE OF MEMBRANE (ROM) NOT AT ARMC: AMNISURE: NEGATIVE

## 2017-03-19 NOTE — MAU Provider Note (Signed)
History   Chief Complaint:  Rupture of Membranes   Kelsey Hanson is  21 y.o. G1P0000 Patient's last menstrual period was 08/17/2016.  Her pregnancy status is positive.  She is 248w4d by early ultrasound.  She presents complaining of Rupture of Membranes  Patient seen in clinic today for routine prenatal care. There was concern for ruptured membranes. Leakage of clear fluids. Throughout this pregnancy, she has had a history of stress incontinence. She often urinates while laughing, sneezing, coughing, and vomiting. This last happened two days ago. She also has a history of hyperemesis gravidarum and severe nausea and vomiting throughout pregnancy. She was concerned today for continuous leakage of fluids but thinks this was likely just urinating given her history. She was throwing up a lot yesterday. Normal fetal movements. No abdominal pain or bloody discharge. By report of the CNM in the office, she was (+) nitrizine and small fern on wet prep slide seen.  Amnisure was obtained by triage RN prior to being placed in a MAU room vs direct admission to HR OB unit.  Patient was transported to U/S dept for AFI, after negative Amnisure.      Past Medical History:  Diagnosis Date  . Medical history non-contributory     Past Surgical History:  Procedure Laterality Date  . NO PAST SURGERIES      Family History  Problem Relation Age of Onset  . Breast cancer Maternal Aunt   . Mental retardation Brother   . Asperger's syndrome Brother     Social History  Substance Use Topics  . Smoking status: Never Smoker  . Smokeless tobacco: Never Used  . Alcohol use No    Allergies: No Known Allergies  Prescriptions Prior to Admission  Medication Sig Dispense Refill Last Dose  . acetaminophen (TYLENOL) 325 MG tablet Take 650 mg by mouth every 6 (six) hours as needed for mild pain or headache.    Not Taking  . Doxylamine-Pyridoxine (DICLEGIS) 10-10 MG TBEC Take 1 tablet with breakfast and lunch.   Take 2 tablets at bedtime. 100 tablet 4 Taking  . omeprazole (PRILOSEC) 20 MG capsule Take 1 capsule (20 mg total) by mouth 2 (two) times daily before a meal. 60 capsule 5 Taking  . ondansetron (ZOFRAN) 8 MG tablet Take 1 tablet (8 mg total) by mouth every 8 (eight) hours as needed for nausea or vomiting. 40 tablet 2 Taking  . Prenatal Vit-Fe Gluconate-FA (OBEGYN LIQUID PRENATAL VITAMIN PO) Take 30 mLs by mouth daily.   Not Taking  . Prenatal-DSS-FeCb-FeGl-FA (CITRANATAL BLOOM) 90-1 MG TABS Take 1 tablet by mouth daily. 30 tablet 12   . promethazine (PHENERGAN) 12.5 MG tablet Take 1 tablet (12.5 mg total) by mouth every 6 (six) hours as needed for nausea or vomiting. 30 tablet 0 Taking  . promethazine (PHENERGAN) 25 MG suppository Place 1 suppository (25 mg total) rectally every 6 (six) hours as needed for nausea or vomiting. 12 each PRN   . Tioconazole (MONISTAT 1 VA) Place 1 application vaginally 3 (three) times daily as needed (for itching).   Not Taking  . Vitamin D, Ergocalciferol, (DRISDOL) 50000 units CAPS capsule Take 1 capsule (50,000 Units total) by mouth every 7 (seven) days. (Patient not taking: Reported on 03/19/2017) 30 capsule 2 Not Taking    Review of Systems - History obtained from the patient General ROS: negative for - chills, fatigue, fever or malaise Respiratory ROS: no cough, shortness of breath, or wheezing Cardiovascular ROS: no chest pain or dyspnea  on exertion Gastrointestinal ROS: no abdominal pain, change in bowel habits, or black or bloody stools Genito-Urinary ROS: positive for - incontinence negative for - dysuria, genital discharge, pelvic pain, urinary frequency/urgency or vulvar/vaginal symptoms  Physical Exam   Blood pressure 121/77, pulse (!) 101, temperature 98 F (36.7 C), temperature source Oral, resp. rate 16, last menstrual period 08/17/2016.  General: General appearance - alert, well appearing, and in no distress, oriented to person, place, and time  and well hydrated Mental status - alert, oriented to person, place, and time, normal mood, behavior, speech, dress, motor activity, and thought processes Chest - clear to auscultation, no wheezes, rales or rhonchi, symmetric air entry Heart - normal rate, regular rhythm, normal S1, S2, no murmurs, rubs, clicks or gallops Abdomen - soft, nontender, nondistended, no masses or organomegaly Pelvic - normal external genitalia, vulva, vagina, cervix, uterus and adnexa, VAGINA: normal appearing vagina with normal color and discharge, no lesions, CERVIX: normal appearing cervix without lesions, visually 1 cm dilated (and per CNM exam earlier today), cervical discharge present - mucoid, exam chaperoned by Raelyn Mora, CNM Neurological - alert, oriented, normal speech, no focal findings or movement disorder noted   Fetal heart tracing: Baseline 140, moderate variability, accels present, no decels Uterine irritability   Labs: Results for orders placed or performed during the hospital encounter of 03/19/17 (from the past 24 hour(s))  Amnisure rupture of membrane (rom)not at [facility]   Collection Time: 03/19/17  4:57 PM  Result Value Ref Range   Amnisure ROM NEGATIVE      Assessment: Patient Active Problem List   Diagnosis Date Noted  . Anemia affecting pregnancy in third trimester 03/04/2017  . Low vitamin D level 01/16/2017  . 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 LGSIL on Pap smear of cervix 01/12/2017  . Supervision of normal first pregnancy, antepartum 01/08/2017  . Late prenatal care affecting pregnancy in first trimester 01/08/2017  . Nausea and vomiting during pregnancy prior to [redacted] weeks gestation 01/08/2017  . Hyperemesis gravidarum before end of [redacted] week gestation with dehydration 01/08/2017     Plan: -Amnisure negative -Speculum exam shows normal cervical mucus. No amniotic fluid appreciated.  -AFI WNL  -Reassured patient that membranes still intact -Discharge to home,  follow up with routine OB on 04/02/2017   Jeanie Cooks, Medical Student 03/19/2017, 5:53 PM   I confirm that I have verified the information documented in the medical student's note and that I have also personally reperformed the physical exam and all medical decision making activities.  Raelyn Mora, CNM 03/19/2017 7:19 PM

## 2017-03-19 NOTE — Progress Notes (Signed)
   PRENATAL VISIT NOTE  Subjective:  Kelsey Hanson is a 21 y.o. G1P0000 at 4325w4d being seen today for ongoing prenatal care.  She is currently monitored for the following issues for this low-risk pregnancy and has Supervision of normal first pregnancy, antepartum; Late prenatal care affecting pregnancy in first trimester; Nausea and vomiting during pregnancy prior to [redacted] weeks gestation; Hyperemesis gravidarum before end of [redacted] week gestation with dehydration; LGSIL on Pap smear of cervix; Low vitamin D level; Cystic fibrosis gene carrier; and Anemia affecting pregnancy in third trimester on her problem list.  Patient reports no bleeding, occasional contractions and reports large gushing multiple times over the last week or so, she thinks is urine, unable to stop it, denies any burning with urination, reports normal fetal movement.  Contractions: Not present. Vag. Bleeding: None.  Movement: Absent. Denies leaking of fluid.   The following portions of the patient's history were reviewed and updated as appropriate: allergies, current medications, past family history, past medical history, past social history, past surgical history and problem list. Problem list updated.  Objective:   Vitals:   03/19/17 1449  BP: 128/78  Pulse: 90  Weight: 142 lb (64.4 kg)    Fetal Status: Fetal Heart Rate (bpm): 150 Fundal Height: 29 cm Movement: Absent  Presentation: Vertex  General:  Alert, oriented and cooperative. Patient is in no acute distress.  Skin: Skin is warm and dry. No rash noted.   Cardiovascular: Normal heart rate noted  Respiratory: Normal respiratory effort, no problems with respiration noted  Abdomen: Soft, gravid, appropriate for gestational age.  Pain/Pressure: Absent     Pelvic: Cervical exam performed Dilation: 1 Effacement (%): 50 Station: Ballotable  Extremities: Normal range of motion.  Edema: None  Mental Status:  Normal mood and affect. Normal behavior. Normal judgment and thought  content.    +Nitrazine, +baby ferns on slide   Assessment and Plan:  Pregnancy: G1P0000 at 8025w4d  1. Supervision of normal first pregnancy, antepartum     Preterm cervical change, probable PPROM.  [facility] Yeast noted: Diflucan sent to the pharmacy.   - Cervicovaginal ancillary only - Fetal fibronectin, stat ordered  2. Low vitamin D level     Taking vitamin D  3. Anemia affecting pregnancy in third trimester     Cannot keep OTC iron down  Preterm labor symptoms and general obstetric precautions including but not limited to vaginal bleeding, contractions, leaking of fluid and fetal movement were reviewed in detail with the patient. Please refer to After Visit Summary for other counseling recommendations.  Return in about 2 weeks (around 04/02/2017) for ROB.   Roe Coombsachelle A Ingram Onnen, CNM

## 2017-03-19 NOTE — MAU Note (Signed)
Pt C/O "peeing a lot" during her entire pregnancy, can't control this.  Doesn't happen every day, last happened two days ago.  Was seen @ MD office today, ? ROM, sent to MAU for verification.  Denies pain or bleeding.

## 2017-03-19 NOTE — Progress Notes (Signed)
Patient complains of throwing up Iron tablets, and [facility]  TDAP given in Right deltoid. Patient tolerated well

## 2017-03-20 LAB — CERVICOVAGINAL ANCILLARY ONLY
BACTERIAL VAGINITIS: NEGATIVE
CANDIDA VAGINITIS: NEGATIVE
CHLAMYDIA, DNA PROBE: NEGATIVE
Neisseria Gonorrhea: NEGATIVE
Trichomonas: NEGATIVE

## 2017-03-24 LAB — FETAL FIBRONECTIN

## 2017-03-25 ENCOUNTER — Other Ambulatory Visit: Payer: Self-pay | Admitting: Certified Nurse Midwife

## 2017-03-25 ENCOUNTER — Telehealth: Payer: Self-pay

## 2017-03-25 DIAGNOSIS — B373 Candidiasis of vulva and vagina: Secondary | ICD-10-CM

## 2017-03-25 DIAGNOSIS — B3731 Acute candidiasis of vulva and vagina: Secondary | ICD-10-CM

## 2017-03-25 DIAGNOSIS — O219 Vomiting of pregnancy, unspecified: Secondary | ICD-10-CM

## 2017-03-25 NOTE — Telephone Encounter (Signed)
Contacted pt to give info about prescriptions being transferred to the correct pharmacy, [facility].

## 2017-03-25 NOTE — Telephone Encounter (Signed)
Returned call, pt wanted to know what pharmacy her prescriptions were sent to.

## 2017-04-02 ENCOUNTER — Ambulatory Visit (INDEPENDENT_AMBULATORY_CARE_PROVIDER_SITE_OTHER): Payer: Medicaid Other | Admitting: Certified Nurse Midwife

## 2017-04-02 VITALS — BP 117/77 | HR 108 | Wt 151.3 lb

## 2017-04-02 DIAGNOSIS — O219 Vomiting of pregnancy, unspecified: Secondary | ICD-10-CM

## 2017-04-02 DIAGNOSIS — O99013 Anemia complicating pregnancy, third trimester: Secondary | ICD-10-CM

## 2017-04-02 DIAGNOSIS — O211 Hyperemesis gravidarum with metabolic disturbance: Secondary | ICD-10-CM

## 2017-04-02 DIAGNOSIS — R7989 Other specified abnormal findings of blood chemistry: Secondary | ICD-10-CM

## 2017-04-02 DIAGNOSIS — D649 Anemia, unspecified: Secondary | ICD-10-CM

## 2017-04-02 DIAGNOSIS — Z3400 Encounter for supervision of normal first pregnancy, unspecified trimester: Secondary | ICD-10-CM

## 2017-04-02 DIAGNOSIS — K219 Gastro-esophageal reflux disease without esophagitis: Secondary | ICD-10-CM

## 2017-04-02 DIAGNOSIS — O99612 Diseases of the digestive system complicating pregnancy, second trimester: Secondary | ICD-10-CM

## 2017-04-02 MED ORDER — OMEPRAZOLE 20 MG PO CPDR: 20.0000 mg | DELAYED_RELEASE_CAPSULE | Freq: Two times a day (BID) | ORAL | 5 refills | Status: DC

## 2017-04-02 MED ORDER — PROMETHAZINE HCL 25 MG RE SUPP: 25.0000 mg | Freq: Four times a day (QID) | RECTAL | 99 refills | Status: DC | PRN

## 2017-04-02 MED ORDER — VITAMIN D (ERGOCALCIFEROL) 1.25 MG (50000 UNIT) PO CAPS: 50000.0000 [IU] | ORAL_CAPSULE | ORAL | 2 refills | Status: AC

## 2017-04-02 MED ORDER — DOXYLAMINE-PYRIDOXINE 10-10 MG PO TBEC: DELAYED_RELEASE_TABLET | ORAL | 4 refills | Status: AC

## 2017-04-02 MED ORDER — CITRANATAL BLOOM 90-1 MG PO TABS: 1.0000 | ORAL_TABLET | Freq: Every day | ORAL | 12 refills | Status: AC

## 2017-04-02 MED ORDER — ONDANSETRON HCL 8 MG PO TABS: 8.0000 mg | ORAL_TABLET | Freq: Three times a day (TID) | ORAL | 2 refills | Status: DC | PRN

## 2017-04-02 NOTE — Progress Notes (Signed)
Patient reports good fetal movement and ocassional pressure.

## 2017-04-02 NOTE — Progress Notes (Signed)
   PRENATAL VISIT NOTE  Subjective:  Kelsey Hanson is a 21 y.o. G1P0000 at 5289w4d being seen today for ongoing prenatal care.  She is currently monitored for the following issues for this low-risk pregnancy and has Supervision of normal first pregnancy, antepartum; LGSIL on Pap smear of cervix; Low vitamin D level; Cystic fibrosis gene carrier; and Anemia affecting pregnancy in third trimester on her problem list.  Patient reports no complaints.  Contractions: Not present. Vag. Bleeding: None.  Movement: Present. Denies leaking of fluid.   The following portions of the patient's history were reviewed and updated as appropriate: allergies, current medications, past family history, past medical history, past social history, past surgical history and problem list. Problem list updated.  Objective:   Vitals:   04/02/17 1537  BP: 117/77  Pulse: (!) 108  Weight: 151 lb 4.8 oz (68.6 kg)    Fetal Status: Fetal Heart Rate (bpm): 137 Fundal Height: 32 cm Movement: Present     General:  Alert, oriented and cooperative. Patient is in no acute distress.  Skin: Skin is warm and dry. No rash noted.   Cardiovascular: Normal heart rate noted  Respiratory: Normal respiratory effort, no problems with respiration noted  Abdomen: Soft, gravid, appropriate for gestational age.  Pain/Pressure: Present     Pelvic: Cervical exam deferred        Extremities: Normal range of motion.  Edema: None  Mental Status:  Normal mood and affect. Normal behavior. Normal judgment and thought content.   Assessment and Plan:  Pregnancy: G1P0000 at 4889w4d  1. Supervision of normal first pregnancy, antepartum     Doing well  2. Anemia affecting pregnancy in third trimester      - Prenatal-DSS-FeCb-FeGl-FA (CITRANATAL BLOOM) 90-1 MG TABS; Take 1 tablet by mouth daily.  Dispense: 30 tablet; Refill: 12  3. Low vitamin D level      - Vitamin D, Ergocalciferol, (DRISDOL) 50000 units CAPS capsule; Take 1 capsule (50,000  Units total) by mouth every 7 (seven) days.  Dispense: 30 capsule; Refill: 2  4. Gastroesophageal reflux during pregnancy, antepartum, second trimester     - omeprazole (PRILOSEC) 20 MG capsule; Take 1 capsule (20 mg total) by mouth 2 (two) times daily before a meal.  Dispense: 60 capsule; Refill: 5  5. Hyperemesis gravidarum before end of [redacted] week gestation with dehydration     - ondansetron (ZOFRAN) 8 MG tablet; Take 1 tablet (8 mg total) by mouth every 8 (eight) hours as needed for nausea or vomiting.  Dispense: 40 tablet; Refill: 2  6. Nausea and vomiting in pregnancy     - Doxylamine-Pyridoxine (DICLEGIS) 10-10 MG TBEC; Take 1 tablet with breakfast and lunch.  Take 2 tablets at bedtime.  Dispense: 100 tablet; Refill: 4     - promethazine (PHENERGAN) 25 MG suppository; Place 1 suppository (25 mg total) rectally every 6 (six) hours as needed for nausea or vomiting.  Dispense: 12 each; Refill: PRN  Preterm labor symptoms and general obstetric precautions including but not limited to vaginal bleeding, contractions, leaking of fluid and fetal movement were reviewed in detail with the patient. Please refer to After Visit Summary for other counseling recommendations.  Return in about 2 weeks (around 04/16/2017) for ROB.   Roe Coombsachelle A Clatie Kessen, CNM

## 2017-04-02 NOTE — Patient Instructions (Signed)
AREA PEDIATRIC/FAMILY PRACTICE PHYSICIANS  [facility] [address], [address] [place], [place]  [address] Phone - [phone]   Fax - [phone]  [facility] [address] [place], [place] [address] Phone - [phone]   Fax - [phone]  [facility] [address] [place], [place]  [address] Phone - [phone]   Fax - [phone]  [facility] [address] [place], [place]  [address] Phone - [phone]   Fax - [phone]  [facility] [address] [place], [place]  [address] Phone - [phone]   Fax - [phone]  [facility] [address] [place], [place]  [address] Phone - [phone]   Fax - [phone]  [facility] [address] [place], [place]  [address] Phone - [phone]   Fax - [phone]  [facility] [address] [place], [place]  [address] Phone - [phone]   Fax - [phone]  [facility] [address] [place], [place]  [address] Phone - [phone]   Fax - [phone] [facility] [address] [place], [place]  [address] Phone - [phone]   Fax - [phone]  [facility] [address] [place], [place]  [address] Phone - [phone]   Fax - [phone]  [facility] [address] [place], [place]  [address] Phone - [phone]   Fax - [phone]  [facility] [address] [place], [place]  [address] Phone - [phone]   Fax - [phone]  [facility] [address] [place], [place]  [address] Phone - [phone]  [facility] [address] [place], [place]  [address] Phone - [phone]   Fax - [phone]  [facility] [address] [place], [place]  [address] Phone - [phone]   Fax - [phone]  [facility] [address] [place], [place]  [address] Phone - [phone]   Fax - [phone]  [facility] [address] [place], [place]  [address] Phone - [phone]   Fax - [phone]   [facility] [address] [place], [place]  [address] Phone - [phone]   Fax - [phone]  [facility] [address] [place], [place]  [address] Phone - [phone]   Fax - [phone]  [facility] [address] [place], [place]  [address] Phone - [phone]   Fax - [phone]  [facility] [address] , [place]  [address] Phone - [phone]   Fax - [phone]  [facility] [address] , [place]  [address] Phone - [phone]   Fax - [phone] [facility] [address] [place], [place]  [address] Phone - [phone]   Fax - [phone]  [facility] [address] [place], [place]  [address] Phone - [phone]   Fax - [phone]  [facility] [address] [place], [place]  [address] Phone - [phone]   Fax - [phone]  [facility] Charlene Flemming MD [address] [place] [address] Phone [phone]  Fax [phone]   

## 2017-04-05 ENCOUNTER — Other Ambulatory Visit: Payer: Self-pay | Admitting: Certified Nurse Midwife

## 2017-04-05 DIAGNOSIS — O219 Vomiting of pregnancy, unspecified: Secondary | ICD-10-CM

## 2017-04-07 ENCOUNTER — Other Ambulatory Visit: Payer: Self-pay | Admitting: Certified Nurse Midwife

## 2017-04-07 DIAGNOSIS — O219 Vomiting of pregnancy, unspecified: Secondary | ICD-10-CM

## 2017-04-07 MED ORDER — PROMETHAZINE HCL 12.5 MG PO TABS: 12.5000 mg | ORAL_TABLET | Freq: Four times a day (QID) | ORAL | 2 refills | Status: AC | PRN

## 2017-04-16 ENCOUNTER — Encounter: Payer: Self-pay | Admitting: Certified Nurse Midwife

## 2017-04-16 ENCOUNTER — Ambulatory Visit (INDEPENDENT_AMBULATORY_CARE_PROVIDER_SITE_OTHER): Payer: Medicaid Other | Admitting: Certified Nurse Midwife

## 2017-04-16 VITALS — BP 128/81 | HR 88 | Wt 152.9 lb

## 2017-04-16 DIAGNOSIS — Z3400 Encounter for supervision of normal first pregnancy, unspecified trimester: Secondary | ICD-10-CM

## 2017-04-16 DIAGNOSIS — R7989 Other specified abnormal findings of blood chemistry: Secondary | ICD-10-CM

## 2017-04-16 DIAGNOSIS — Z3403 Encounter for supervision of normal first pregnancy, third trimester: Secondary | ICD-10-CM

## 2017-04-16 DIAGNOSIS — E559 Vitamin D deficiency, unspecified: Secondary | ICD-10-CM

## 2017-04-16 DIAGNOSIS — O99013 Anemia complicating pregnancy, third trimester: Secondary | ICD-10-CM

## 2017-04-16 NOTE — Patient Instructions (Signed)
AREA PEDIATRIC/FAMILY PRACTICE PHYSICIANS  [facility] [address] [place], [place]  [address] Phone - [phone]   Fax - [phone]  [facility] [address] [place], [place] [address] Phone - [phone]   Fax - [phone]  [facility] [address] [place], [place]  [address] Phone - [phone]   Fax - [phone]  [facility] [address] [place], [place]  [address] Phone - [phone]   Fax - [phone]  [facility] [address] [place], [place]  [address] Phone - [phone]   Fax - [phone]  [facility] [address] [place], [place]  [address] Phone - [phone]   Fax - [phone]  [facility] [address] [place], [place]  [address] Phone - [phone]   Fax - [phone]  [facility] [address] [place], [place]  [address] Phone - [phone]   Fax - [phone]  [facility] [address] [place], [place]  [address] Phone - [phone]   Fax - [phone] [facility] [address] [place], [place]  [address] Phone - [phone]   Fax - [phone]  [facility] [address] [place], [place]  [address] Phone - [phone]   Fax - [phone]  [facility] [address] [place], [place]  [address] Phone - [phone]   Fax - [phone]  [facility] [address] [place], [place]  [address] Phone - [phone]   Fax - [phone]  [facility] GOSR[facility] [address] [place], [place]  [address] Phone - [phone]  [facility] [address] [place], [place]  [address] Phone - [phone]   Fax - [phone]  [facility] [address] [place], [place]  [address] Phone - [phone]   Fax - [phone]  [facility] [address] [place], [place]  [address] Phone - [phone]   Fax - [phone]  [facility] [address] [place], [place]  [address] Phone - [phone]   Fax - [phone]   [facility] [address] [place], [place]  [address] Phone - [phone]   Fax - [phone]  [facility] [address] [place], [place]  [address] Phone - [phone]   Fax - [phone]  [facility] [address] [place], [place]  [address] Phone - [phone]   Fax - [phone]  [facility] [address] [place], [place]  [address] Phone - [phone]   Fax - [phone]  [facility] [address] , [place]  [address] Phone - [phone]   Fax - [phone] [facility] [address] [place], [place]  [address] Phone - [phone]   Fax - [phone]  [facility] [address] [place], [place]  [address] Phone - [phone]   Fax - [phone]  [facility] [address] [place], [place]  [address] Phone - [phone]   Fax - [phone]  [facility] [place] Charlene Flemming MD [address] [place] [address] Phone [phone]  Fax [phone]   

## 2017-04-16 NOTE — Progress Notes (Signed)
   PRENATAL VISIT NOTE  Subjective:  Kelsey Hanson is a 21 y.o. G1P0000 at 3529w4d being seen today for ongoing prenatal care.  She is currently monitored for the following issues for this low-risk pregnancy and has Supervision of normal first pregnancy, antepartum; LGSIL on Pap smear of cervix; Low vitamin D level; Cystic fibrosis gene carrier; and Anemia affecting pregnancy in third trimester on her problem list.  Patient reports no complaints.  Contractions: Not present. Vag. Bleeding: None.  Movement: Present. Denies leaking of fluid.   The following portions of the patient's history were reviewed and updated as appropriate: allergies, current medications, past family history, past medical history, past social history, past surgical history and problem list. Problem list updated.  Objective:   Vitals:   04/16/17 1341  BP: 128/81  Pulse: 88  Weight: 152 lb 14.4 oz (69.4 kg)    Fetal Status: Fetal Heart Rate (bpm): 136 Fundal Height: 34 cm Movement: Present     General:  Alert, oriented and cooperative. Patient is in no acute distress.  Skin: Skin is warm and dry. No rash noted.   Cardiovascular: Normal heart rate noted  Respiratory: Normal respiratory effort, no problems with respiration noted  Abdomen: Soft, gravid, appropriate for gestational age.  Pain/Pressure: Present     Pelvic: Cervical exam deferred        Extremities: Normal range of motion.  Edema: None  Mental Status:  Normal mood and affect. Normal behavior. Normal judgment and thought content.   Assessment and Plan:  Pregnancy: G1P0000 at 6329w4d  1. Supervision of normal first pregnancy, antepartum      Doing well  2. Low vitamin D level      Taking weekly vitamin D  3. Anemia affecting pregnancy in third trimester     Taking bloom.   Preterm labor symptoms and general obstetric precautions including but not limited to vaginal bleeding, contractions, leaking of fluid and fetal movement were reviewed in detail  with the patient. Please refer to After Visit Summary for other counseling recommendations.  Return in about 1 week (around 04/23/2017) for ROB, GBS.   Roe Coombsachelle A Swati Granberry, CNM

## 2017-04-17 ENCOUNTER — Telehealth: Payer: Self-pay | Admitting: *Deleted

## 2017-04-17 ENCOUNTER — Other Ambulatory Visit: Payer: Self-pay | Admitting: Certified Nurse Midwife

## 2017-04-17 NOTE — Telephone Encounter (Signed)
Patient notified

## 2017-04-17 NOTE — Telephone Encounter (Signed)
Patient is requesting a refill of her Promethazine- she states she will be out tomorrow.

## 2017-04-17 NOTE — Telephone Encounter (Signed)
Please let her know that it was reordered on 04/07/17, with 2 refills.  Please have her call the pharmacy.  Thank you. Kelsey Hanson

## 2017-04-21 ENCOUNTER — Encounter (HOSPITAL_COMMUNITY): Payer: Self-pay | Admitting: *Deleted

## 2017-04-21 ENCOUNTER — Inpatient Hospital Stay (HOSPITAL_COMMUNITY)
Admission: AD | Admit: 2017-04-21 | Discharge: 2017-04-24 | DRG: 775 | Disposition: A | Discharge status: Home / Self Care | Payer: Medicaid Other | Source: Ambulatory Visit | Attending: Obstetrics and Gynecology | Admitting: Obstetrics and Gynecology

## 2017-04-21 DIAGNOSIS — D649 Anemia, unspecified: Secondary | ICD-10-CM | POA: Diagnosis present

## 2017-04-21 DIAGNOSIS — O42913 Preterm premature rupture of membranes, unspecified as to length of time between rupture and onset of labor, third trimester: Principal | ICD-10-CM | POA: Diagnosis present

## 2017-04-21 DIAGNOSIS — R109 Unspecified abdominal pain: Secondary | ICD-10-CM | POA: Diagnosis not present

## 2017-04-21 DIAGNOSIS — O99824 Streptococcus B carrier state complicating childbirth: Secondary | ICD-10-CM | POA: Diagnosis not present

## 2017-04-21 DIAGNOSIS — Z818 Family history of other mental and behavioral disorders: Secondary | ICD-10-CM | POA: Diagnosis not present

## 2017-04-21 DIAGNOSIS — Z3A35 35 weeks gestation of pregnancy: Secondary | ICD-10-CM

## 2017-04-21 DIAGNOSIS — N898 Other specified noninflammatory disorders of vagina: Secondary | ICD-10-CM | POA: Diagnosis present

## 2017-04-21 DIAGNOSIS — Z141 Cystic fibrosis carrier: Secondary | ICD-10-CM

## 2017-04-21 DIAGNOSIS — O42013 Preterm premature rupture of membranes, onset of labor within 24 hours of rupture, third trimester: Secondary | ICD-10-CM | POA: Diagnosis not present

## 2017-04-21 DIAGNOSIS — O9902 Anemia complicating childbirth: Secondary | ICD-10-CM | POA: Diagnosis present

## 2017-04-21 DIAGNOSIS — O42919 Preterm premature rupture of membranes, unspecified as to length of time between rupture and onset of labor, unspecified trimester: Secondary | ICD-10-CM | POA: Diagnosis present

## 2017-04-21 DIAGNOSIS — Z803 Family history of malignant neoplasm of breast: Secondary | ICD-10-CM | POA: Diagnosis not present

## 2017-04-21 DIAGNOSIS — Z79899 Other long term (current) drug therapy: Secondary | ICD-10-CM | POA: Diagnosis not present

## 2017-04-21 LAB — CBC
HCT: 31.7 % — ABNORMAL LOW (ref 36.0–46.0)
Hemoglobin: 10.4 g/dL — ABNORMAL LOW (ref 12.0–15.0)
MCH: 28.3 pg (ref 26.0–34.0)
MCHC: 32.8 g/dL (ref 30.0–36.0)
MCV: 86.1 fL (ref 78.0–100.0)
PLATELETS: 194 10*3/uL (ref 150–400)
RBC: 3.68 MIL/uL — ABNORMAL LOW (ref 3.87–5.11)
RDW: 15.7 % — AB (ref 11.5–15.5)
WBC: 8.2 10*3/uL (ref 4.0–10.5)

## 2017-04-21 LAB — TYPE AND SCREEN
ABO/RH(D): O POS
Antibody Screen: NEGATIVE

## 2017-04-21 LAB — POCT FERN TEST: POCT Fern Test: POSITIVE

## 2017-04-21 MED ORDER — FENTANYL CITRATE (PF) 100 MCG/2ML IJ SOLN: 100.0000 ug | INTRAMUSCULAR | Status: DC | PRN

## 2017-04-21 MED ORDER — ONDANSETRON HCL 4 MG/2ML IJ SOLN
4.0000 mg | Freq: Four times a day (QID) | INTRAMUSCULAR | Status: DC | PRN
  Administered 2017-04-21: 4 mg via INTRAVENOUS
  Filled 2017-04-21: qty 2

## 2017-04-21 MED ORDER — OXYTOCIN 40 UNITS IN LACTATED RINGERS INFUSION - SIMPLE MED
2.5000 [IU]/h | INTRAVENOUS | Status: DC
  Administered 2017-04-22: 2.5 [IU]/h via INTRAVENOUS
  Filled 2017-04-21: qty 1000

## 2017-04-21 MED ORDER — TERBUTALINE SULFATE 1 MG/ML IJ SOLN: 0.2500 mg | Freq: Once | INTRAMUSCULAR | Status: DC | PRN

## 2017-04-21 MED ORDER — FLEET ENEMA 7-19 GM/118ML RE ENEM: 1.0000 | ENEMA | RECTAL | Status: DC | PRN

## 2017-04-21 MED ORDER — LACTATED RINGERS IV SOLN
INTRAVENOUS | Status: DC
  Administered 2017-04-21 (×3): via INTRAVENOUS

## 2017-04-21 MED ORDER — LIDOCAINE HCL (PF) 1 % IJ SOLN
30.0000 mL | INTRAMUSCULAR | Status: DC | PRN
  Filled 2017-04-21: qty 30

## 2017-04-21 MED ORDER — MISOPROSTOL 50MCG HALF TABLET
50.0000 ug | ORAL_TABLET | ORAL | Status: DC | PRN
  Administered 2017-04-21 (×2): 50 ug via ORAL
  Filled 2017-04-21 (×2): qty 1

## 2017-04-21 MED ORDER — FENTANYL CITRATE (PF) 100 MCG/2ML IJ SOLN: 50.0000 ug | INTRAMUSCULAR | Status: DC | PRN

## 2017-04-21 MED ORDER — OXYCODONE-ACETAMINOPHEN 5-325 MG PO TABS: 2.0000 | ORAL_TABLET | ORAL | Status: DC | PRN

## 2017-04-21 MED ORDER — OXYTOCIN 40 UNITS IN LACTATED RINGERS INFUSION - SIMPLE MED
1.0000 m[IU]/min | INTRAVENOUS | Status: DC
  Administered 2017-04-21: 2 m[IU]/min via INTRAVENOUS

## 2017-04-21 MED ORDER — LACTATED RINGERS IV SOLN
500.0000 mL | INTRAVENOUS | Status: DC | PRN
  Administered 2017-04-21: 500 mL via INTRAVENOUS

## 2017-04-21 MED ORDER — BETAMETHASONE SOD PHOS & ACET 6 (3-3) MG/ML IJ SUSP
12.0000 mg | Freq: Once | INTRAMUSCULAR | Status: DC
  Administered 2017-04-21: 12 mg via INTRAMUSCULAR
  Filled 2017-04-21: qty 2

## 2017-04-21 MED ORDER — SOD CITRATE-CITRIC ACID 500-334 MG/5ML PO SOLN: 30.0000 mL | ORAL | Status: DC | PRN

## 2017-04-21 MED ORDER — BETAMETHASONE SOD PHOS & ACET 6 (3-3) MG/ML IJ SUSP
12.0000 mg | INTRAMUSCULAR | Status: DC
  Filled 2017-04-21: qty 2

## 2017-04-21 MED ORDER — PENICILLIN G POTASSIUM 5000000 UNITS IJ SOLR
5.0000 10*6.[IU] | Freq: Once | INTRAVENOUS | Status: AC
  Administered 2017-04-21: 5 10*6.[IU] via INTRAVENOUS
  Filled 2017-04-21: qty 5

## 2017-04-21 MED ORDER — PENICILLIN G POT IN DEXTROSE 60000 UNIT/ML IV SOLN
3.0000 10*6.[IU] | INTRAVENOUS | Status: DC
  Administered 2017-04-21 (×3): 3 10*6.[IU] via INTRAVENOUS
  Filled 2017-04-21 (×7): qty 50

## 2017-04-21 MED ORDER — ACETAMINOPHEN 325 MG PO TABS: 650.0000 mg | ORAL_TABLET | ORAL | Status: DC | PRN

## 2017-04-21 MED ORDER — OXYTOCIN BOLUS FROM INFUSION
500.0000 mL | Freq: Once | INTRAVENOUS | Status: AC
  Administered 2017-04-22: 500 mL via INTRAVENOUS

## 2017-04-21 MED ORDER — BETAMETHASONE SOD PHOS & ACET 6 (3-3) MG/ML IJ SUSP
12.0000 mg | Freq: Once | INTRAMUSCULAR | Status: DC
  Filled 2017-04-21: qty 2

## 2017-04-21 MED ORDER — OXYCODONE-ACETAMINOPHEN 5-325 MG PO TABS: 1.0000 | ORAL_TABLET | ORAL | Status: DC | PRN

## 2017-04-21 NOTE — Progress Notes (Addendum)
Patient ID: Kelsey Hanson, female   DOB: 27-Aug-1996, 22 y.o.   MRN: [id number]  Breathing w/ ctx; s/p cyto x 2 doses; PCN  BP 136/77, other VSS FHR 115-120s, +accels, no decels, Cat 1 Ctx q 2-3 mins Cx by RN 4-5/80/+1 (unchanged from 2 hrs)  IUP@35 .2wks PPROM Latent phase GBS unk- PCN  Will start Pit to help strengthen ctx Anticipate SVD  Cam Hai CNM 04/21/2017 10:10 PM

## 2017-04-21 NOTE — Progress Notes (Signed)
Labor Progress Note Kelsey Hanson is a 21 y.o. female G1P0000 with IUP at [redacted]w[redacted]d by LMP presenting for PPROM  S: Patient seen & examined for progress of labor. Patient is in moderate distress during contractions but is breathing well through them. Mother and father are at bedside. She refuses any form of pain medication.  O: BP 136/77   Pulse 75   Temp 97.7 F (36.5 C) (Oral)   Resp 18   Ht 5\' 4"  (1.626 m)   Wt 68.9 kg (152 lb)   LMP 08/17/2016   SpO2 99%   BMI 26.09 kg/m   FHT: 115bpm, mod var, +accels, variable decels TOCO: q2-67min, patient in moderate distress during contractions  CVE: Dilation: 4.5 Effacement (%): 80 Cervical Position: Middle Station: 0, +1 Presentation: Vertex Exam by:: Jazma Phelps,MD  A&P: 21 y.o. G1P0000 [redacted]w[redacted]d here for IOL for PPROM  Labor:In active labor, contractions 2-68min apart, augment as necessary with 2x2 pitocin Fetal Wellbeing:Category 2 Pain Control: Labor support without medications. Medications available per request Anticipated JGG:EZMO  PPROM: Continue to closely monitor FHT, continue limit cervical checks to reduce risk of infection. Continue penicillin q4hrs, last dose at 5pm  [facility] Medical Student 04/21/2017 7:41 PM

## 2017-04-21 NOTE — MAU Provider Note (Signed)
HPI: Kelsey Hanson is a 21 y.o. year old G44P0000 female at [redacted]w[redacted]d weeks gestation who presents to MAU reporting Spontaneous rupture of membranes possibly as long ago as two days ago. Had large gush of clear fluid at 2:40 AM. Also reports mild contractions.   Pos fern Dilation: 1 Effacement (%): 50 Station: Ballotable Presentation: Vertex Exam by:: Ivonne Andrew CNM  Plan Admission to [facility] and augmentation, but called [facility] to see if it is OK to admit vs transfer considering hign census in NICU. Talked to Dr. Joselyn Glassman who will call back with decision. Pt and family made aware BMZ PCN for unk GBS  Katrinka Blazing [place], CNM 04/21/2017 8:36 AM    Admit to labor, NICU Ok with admission   Rasch, Harolyn Rutherford, NP 04/21/2017 8:45 AM

## 2017-04-21 NOTE — MAU Note (Signed)
Pt here with c/o ROM at 0240, clear fluid. Having occasional contractions. Denies any bleeding. Reports good fetal movement.

## 2017-04-21 NOTE — Anesthesia Pain Management Evaluation Note (Signed)
  CRNA Pain Management Visit Note  Patient: Kelsey Hanson, 21 y.o., female  "Hello I am a member of the anesthesia team at [facility]. We have an anesthesia team available at all times to provide care throughout the hospital, including epidural management and anesthesia for C-section. I don't know your plan for the delivery whether it a natural birth, water birth, IV sedation, nitrous supplementation, doula or epidural, but we want to meet your pain goals."   1.Was your pain managed to your expectations on prior hospitalizations?   No prior hospitalizations  2.What is your expectation for pain management during this hospitalization?     Epidural, IV pain meds and Nitrous Oxide  3.How can we help you reach that goal? Patient keeping options open.  Record the patient's initial score and the patient's pain goal.   Pain: 6  Pain Goal: 7 [facility] wants you to be able to say your pain was always managed very well.  Lester Platas L 04/21/2017

## 2017-04-21 NOTE — H&P (Signed)
LABOR ADMISSION HISTORY AND PHYSICAL  Kelsey Hanson is a 21 y.o. female G1P0000 with IUP at [redacted]w[redacted]d by LMP presenting for PPROM. She reports +FMs, +LOF, no VB, no blurry vision, no headaches, no peripheral edema, and no RUQ pain.  She started losing fluid about two days ago, enough to soak a pad.  She describes it as a trickle of fluid.  However, at around 0230 this morning, she experienced a gush of fluid.  She is wanting to hold off on pain medications unless she changes her mind later in labor.  She plans on breast feeding. She says that she is going to be abstinent after baby's birth, so she will not need contraception.  She has had recurrent UTIs, hyperemesis gravidarum, anemia during pregnancy.  Dating: By LMP --->  Estimated Date of Delivery: 05/24/17  Sono:   @[redacted]w[redacted]d , CWD, normal anatomy, cephalic presentation, 480g, 52% EFW  Prenatal History/Complications: [facility] for prenatal care Hyperemesis gravidarum Anemia during pregnancy UTI  Past Medical History: Past Medical History:  Diagnosis Date  . Medical history non-contributory     Past Surgical History: Past Surgical History:  Procedure Laterality Date  . TOOTH EXTRACTION      Obstetrical History: OB History    Gravida Para Term Preterm AB Living   1 0 0 0 0 0   SAB TAB Ectopic Multiple Live Births   0 0 0 0 0      Social History: Social History   Social History  . Marital status: Single    Spouse name: N/A  . Number of children: N/A  . Years of education: N/A   Social History Main Topics  . Smoking status: Never Smoker  . Smokeless tobacco: Never Used  . Alcohol use No  . Drug use: No  . Sexual activity: Yes   Other Topics Concern  . None   Social History Narrative  . None    Family History: Family History  Problem Relation Age of Onset  . Breast cancer Maternal Aunt   . Mental retardation Brother   . Asperger's syndrome Brother     Allergies: No Known Allergies  Prescriptions Prior to  Admission  Medication Sig Dispense Refill Last Dose  . acetaminophen (TYLENOL) 325 MG tablet Take 650 mg by mouth every 6 (six) hours as needed for mild pain or headache.    Not Taking  . Doxylamine-Pyridoxine (DICLEGIS) 10-10 MG TBEC Take 1 tablet with breakfast and lunch.  Take 2 tablets at bedtime. 100 tablet 4 Taking  . Prenatal Vit-Fe Gluconate-FA (OBEGYN LIQUID PRENATAL VITAMIN PO) Take 30 mLs by mouth daily.   Taking  . Prenatal-DSS-FeCb-FeGl-FA (CITRANATAL BLOOM) 90-1 MG TABS Take 1 tablet by mouth daily. 30 tablet 12 Taking  . promethazine (PHENERGAN) 12.5 MG tablet Take 1 tablet (12.5 mg total) by mouth every 6 (six) hours as needed for nausea or vomiting. 30 tablet 2 Taking  . promethazine (PHENERGAN) 25 MG suppository Place 1 suppository (25 mg total) rectally every 6 (six) hours as needed for nausea or vomiting. (Patient not taking: Reported on 04/16/2017) 12 each PRN Not Taking  . Vitamin D, Ergocalciferol, (DRISDOL) 50000 units CAPS capsule Take 1 capsule (50,000 Units total) by mouth every 7 (seven) days. 30 capsule 2 Taking     Review of Systems   All systems reviewed and negative except as stated in HPI  Blood pressure 136/80, pulse 93, temperature 98.7 F (37.1 C), temperature source Oral, resp. rate 18, height 5\' 4"  (1.626 m), weight 68.9  kg (152 lb), last menstrual period 08/17/2016, SpO2 99 %. General appearance: alert, cooperative and appears stated age Lungs: normal work of breathing Cardiac: RRR Extremities: Homans sign is negative, no sign of DVT Presentation: cephalic Fetal monitoringBaseline: 130 bpm, Variability: Good {> 6 bpm), Accelerations: Reactive and Decelerations: Absent Uterine activityFrequency: Every 5-7 minutes Dilation: 1 Effacement (%): 50 Station: Ballotable Exam by:: Ivonne Andrew CNM   Prenatal labs: ABO, Rh: O/Positive/-- (05/09 1124) Antibody: Negative (05/09 1124) Rubella: 15.90 (05/09 1124) RPR: Non Reactive (07/02 1039)  HBsAg: Negative  (05/09 1124)  HIV:   Non Reactive GBS:   unknown GTT: Third trimester wnl  Prenatal Transfer Tool  Maternal Diabetes: No Genetic Screening: Normal Maternal Ultrasounds/Referrals: Normal Fetal Ultrasounds or other Referrals:  None Maternal Substance Abuse:  No Significant Maternal Medications:  None Significant Maternal Lab Results: Lab values include: Other: GBS unknown status  Results for orders placed or performed during the hospital encounter of 04/21/17 (from the past 24 hour(s))  Fern Test   Collection Time: 04/21/17  7:54 AM  Result Value Ref Range   POCT Fern Test Positive = ruptured amniotic membanes   CBC   Collection Time: 04/21/17  8:50 AM  Result Value Ref Range   WBC 8.2 4.0 - 10.5 K/uL   RBC 3.68 (L) 3.87 - 5.11 MIL/uL   Hemoglobin 10.4 (L) 12.0 - 15.0 g/dL   HCT 16.1 (L) 09.6 - 04.5 %   MCV 86.1 78.0 - 100.0 fL   MCH 28.3 26.0 - 34.0 pg   MCHC 32.8 30.0 - 36.0 g/dL   RDW 40.9 (H) 81.1 - 91.4 %   Platelets 194 150 - 400 K/uL    Patient Active Problem List   Diagnosis Date Noted  . Anemia affecting pregnancy in third trimester 03/04/2017  . Low vitamin D level 01/16/2017  . 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 LGSIL on Pap smear of cervix 01/12/2017  . Supervision of normal first pregnancy, antepartum 01/08/2017    Assessment: Tiffiany Beadles is a 21 y.o. G1P0000 at [redacted]w[redacted]d here for IOL 2/2 PPROM.  #Labor: augmentation per protocol. Will give cytotec for cervical ripening. Limit cervical exams.  #Preterm Infant: NICU aware, has received one dose of betamethasone. Will give second dose if able in 24hrs. #Pain: Epidural upon request, labor support without medications at this time #FWB: Cat I #ID: GBS unknown; will initiate PCN #MOF: breast #MOC: abstinence #Circ: Will get a circumcision outpatient   Lezlie Octave, MD Family Medicine Resident PGY-1  04/21/2017, 9:35 AM  OB FELLOW HISTORY AND PHYSICAL ATTESTATION  I confirm that I have verified  the information documented in the resident's note and that I have also personally reperformed the physical exam and all medical decision making activities. I agree with above documentation and have made edits as needed.   Caryl Ada OB Fellow 04/21/2017, 11:54 AM

## 2017-04-21 NOTE — Progress Notes (Signed)
Labor Progress Note Kelsey Hanson is a 21 y.o. female G1P0000 with IUP at [redacted]w[redacted]d by LMP presenting for PPROM  S: Patient seen & examined for progress of labor. Patient doing well and requests to be able to ambulate around room while on monitor.  O: BP (!) 141/86   Pulse 81   Temp 98.2 F (36.8 C) (Oral)   Resp 18   Ht 5\' 4"  (1.626 m)   Wt 68.9 kg (152 lb)   LMP 08/17/2016   SpO2 99%   BMI 26.09 kg/m   FHT: 120bpm, mod var, +accels, variable decels TOCO: q3-54min, patient looks comfortable during contractions  CVE: Dilation: 1 Effacement (%): Thick Cervical Position: Posterior Station: Ballotable Presentation: Vertex (confirmed by u/s) Exam by:: Dr. Doroteo Glassman  A&P: 21 y.o. G1P0000 [redacted]w[redacted]d here for IOL for PPROM  Labor:Last dose of cytotec 25 mcg @ 5pm Fetal Wellbeing:Category 2 Pain Control: Labor support without medications. Medications available per request Anticipated XUX:YBFX  PPROM: Continue to closely monitor FHT,limit  cervical checks to reduce risk of infection  Julieanne Cotton Medical Student 04/21/2017 4:56 PM

## 2017-04-22 ENCOUNTER — Encounter (HOSPITAL_COMMUNITY): Payer: Self-pay

## 2017-04-22 DIAGNOSIS — O99824 Streptococcus B carrier state complicating childbirth: Secondary | ICD-10-CM

## 2017-04-22 DIAGNOSIS — O42013 Preterm premature rupture of membranes, onset of labor within 24 hours of rupture, third trimester: Secondary | ICD-10-CM

## 2017-04-22 DIAGNOSIS — Z3A35 35 weeks gestation of pregnancy: Secondary | ICD-10-CM

## 2017-04-22 LAB — CBC
HCT: 31.5 % — ABNORMAL LOW (ref 36.0–46.0)
HEMOGLOBIN: 10.6 g/dL — AB (ref 12.0–15.0)
MCH: 28.7 pg (ref 26.0–34.0)
MCHC: 33.7 g/dL (ref 30.0–36.0)
MCV: 85.4 fL (ref 78.0–100.0)
Platelets: 238 10*3/uL (ref 150–400)
RBC: 3.69 MIL/uL — ABNORMAL LOW (ref 3.87–5.11)
RDW: 15.7 % — ABNORMAL HIGH (ref 11.5–15.5)
WBC: 20.1 10*3/uL — ABNORMAL HIGH (ref 4.0–10.5)

## 2017-04-22 LAB — RPR: RPR: NONREACTIVE

## 2017-04-22 MED ORDER — MISOPROSTOL 200 MCG PO TABS
ORAL_TABLET | ORAL | Status: AC
  Filled 2017-04-22: qty 4

## 2017-04-22 MED ORDER — OXYCODONE HCL 5 MG PO TABS
5.0000 mg | ORAL_TABLET | ORAL | Status: DC | PRN
  Administered 2017-04-23: 5 mg via ORAL
  Filled 2017-04-22: qty 1

## 2017-04-22 MED ORDER — SENNOSIDES-DOCUSATE SODIUM 8.6-50 MG PO TABS
2.0000 | ORAL_TABLET | ORAL | Status: DC
  Filled 2017-04-22 (×2): qty 2

## 2017-04-22 MED ORDER — PRENATAL MULTIVITAMIN CH
1.0000 | ORAL_TABLET | Freq: Every day | ORAL | Status: DC
  Administered 2017-04-22 – 2017-04-24 (×3): 1 via ORAL
  Filled 2017-04-22 (×3): qty 1

## 2017-04-22 MED ORDER — SIMETHICONE 80 MG PO CHEW: 80.0000 mg | CHEWABLE_TABLET | ORAL | Status: DC | PRN

## 2017-04-22 MED ORDER — DIPHENHYDRAMINE HCL 25 MG PO CAPS: 25.0000 mg | ORAL_CAPSULE | Freq: Four times a day (QID) | ORAL | Status: DC | PRN

## 2017-04-22 MED ORDER — WITCH HAZEL-GLYCERIN EX PADS: 1.0000 "application " | MEDICATED_PAD | CUTANEOUS | Status: DC | PRN

## 2017-04-22 MED ORDER — ZOLPIDEM TARTRATE 5 MG PO TABS: 5.0000 mg | ORAL_TABLET | Freq: Every evening | ORAL | Status: DC | PRN

## 2017-04-22 MED ORDER — MISOPROSTOL 200 MCG PO TABS
800.0000 ug | ORAL_TABLET | Freq: Once | ORAL | Status: AC
  Administered 2017-04-22: 800 ug via VAGINAL

## 2017-04-22 MED ORDER — BENZOCAINE-MENTHOL 20-0.5 % EX AERO: 1.0000 "application " | INHALATION_SPRAY | CUTANEOUS | Status: DC | PRN

## 2017-04-22 MED ORDER — COCONUT OIL OIL: 1.0000 "application " | TOPICAL_OIL | Status: DC | PRN

## 2017-04-22 MED ORDER — DIPHENHYDRAMINE HCL 25 MG PO CAPS
50.0000 mg | ORAL_CAPSULE | Freq: Once | ORAL | Status: AC
Start: 2017-04-22 — End: 2017-04-22
  Administered 2017-04-22: 50 mg via ORAL
  Filled 2017-04-22: qty 2

## 2017-04-22 MED ORDER — IBUPROFEN 600 MG PO TABS
600.0000 mg | ORAL_TABLET | Freq: Four times a day (QID) | ORAL | Status: DC
  Administered 2017-04-22 – 2017-04-23 (×3): 600 mg via ORAL
  Filled 2017-04-22 (×6): qty 1

## 2017-04-22 MED ORDER — TETANUS-DIPHTH-ACELL PERTUSSIS 5-2.5-18.5 LF-MCG/0.5 IM SUSP: 0.5000 mL | Freq: Once | INTRAMUSCULAR | Status: DC

## 2017-04-22 MED ORDER — ACETAMINOPHEN 325 MG PO TABS
650.0000 mg | ORAL_TABLET | ORAL | Status: DC | PRN
  Administered 2017-04-22 – 2017-04-23 (×2): 650 mg via ORAL
  Filled 2017-04-22 (×2): qty 2

## 2017-04-22 MED ORDER — ONDANSETRON HCL 4 MG/2ML IJ SOLN: 4.0000 mg | INTRAMUSCULAR | Status: DC | PRN

## 2017-04-22 MED ORDER — DIBUCAINE 1 % RE OINT: 1.0000 "application " | TOPICAL_OINTMENT | RECTAL | Status: DC | PRN

## 2017-04-22 MED ORDER — ONDANSETRON HCL 4 MG PO TABS: 4.0000 mg | ORAL_TABLET | ORAL | Status: DC | PRN

## 2017-04-22 NOTE — Progress Notes (Signed)
Patient states," I feel better my throat is scratchy and tingly but I think it was not a reaction to the pineapple but from screaming in labor." Instructed patient to not eat or drink pineapple juice this hospital visit. Patient verbalized understanding. Speech is clear and lips are dry. Encouraged to drink lots of water.

## 2017-04-22 NOTE — Progress Notes (Signed)
Was called to room 133 because patient was complaining og "i think I am allergic to pineapple".patient stated,"my mouth and throat feel numb and tingly and my throat feels tight." speech is clear, lips appear chapped. spo2 on RA 100%, chest sounds clear with no increased WOB. 0257 Rapid response nurse called. 0300 House coverage called.[id number] Kelsey Hanson was called and ordered benadryl 50 mg po now.  Will continue to monitor patient.

## 2017-04-22 NOTE — Lactation Note (Signed)
This note was copied from a baby's chart. Lactation Consultation Note  Patient Name: Kelsey Hanson [id number] Date: 04/22/2017 Reason for consult: Initial assessment;1st time breastfeeding;Infant < 6lbs;Late-preterm 29-36.6wks Mom states she forgot to call for feeding assessment but baby fed for 40 minutes about an hour ago.  Reminded to call for feeding assessment.  Instructed to use good breast massage during feeding.  Maternal Data Has patient been taught Hand Expression?: Yes Does the patient have breastfeeding experience prior to this delivery?: No  Feeding Feeding Type: Breast Fed Length of feed: 40 min  LATCH Score                   Interventions Interventions: Breast feeding basics reviewed  Lactation Tools Discussed/Used     Consult Status Consult Status: Follow-up Date: 04/23/17 Follow-up type: In-patient    Huston Foley 04/22/2017, 3:02 PM

## 2017-04-22 NOTE — Plan of Care (Signed)
Problem: Nutritional: Goal: Mothers verbalization of comfort with breastfeeding process will improve Outcome: Progressing Encouraged pt to call for latch assessment and to do hand expression and use DEBP

## 2017-04-22 NOTE — Lactation Note (Signed)
This note was copied from a baby's chart. Lactation Consultation Note  Patient Name: Kelsey Hanson [id number] Date: 04/22/2017 Reason for consult: Initial assessment;1st time breastfeeding;Infant < 6lbs;Late-preterm 34-36.6wks Breastfeeding consultation services and Caring For Your Late Preterm Baby handout given to patient.  This is mom's first baby and newborn is 7 hours old.  Baby is 35.3 weeks.  Discussed late preterm behavior.  Baby has been to breast 3 times and mom states it went well.  She has also pumped once and spoon fed colostrum to baby.  Instructed to feed with cues but at least every 3 hours.  Instructed to call for LC feeding assessment with next feed.  Maternal Data Has patient been taught Hand Expression?: Yes Does the patient have breastfeeding experience prior to this delivery?: No  Feeding Feeding Type: Breast Fed Length of feed: 10 min  LATCH Score                   Interventions Interventions: Breast feeding basics reviewed  Lactation Tools Discussed/Used     Consult Status Consult Status: Follow-up Date: 04/23/17 Follow-up type: In-patient    Huston Foley 04/22/2017, 11:41 AM

## 2017-04-23 ENCOUNTER — Encounter: Payer: Medicaid Other | Admitting: Certified Nurse Midwife

## 2017-04-23 NOTE — Lactation Note (Signed)
This note was copied from a baby's chart. Lactation Consultation Note  Patient Name: Kelsey Hanson [id number] Date: 04/23/2017 Reason for consult: Follow-up assessment Baby at 39 hr of life. Baby has not had a stool in lifetime. Lactation did not see a latch because mom did not call before she fed the baby. Mom was agreeable to manual expression and spoon feeding. She was able to express 86ml easily. Baby tolerated spoon feeding well. Mom will offer the breast on demand q3hr, post express, and offer expressed milk per volume guidelines. Reviewed LPT guidelines. Mom is aware of lactation services and support group. She will call for lactation to view the next latch. Report given to MD and RN.   Maternal Data    Feeding Feeding Type: Breast Milk Length of feed: 60 min  LATCH Score Latch: Grasps breast easily, tongue down, lips flanged, rhythmical sucking.  Audible Swallowing: A few with stimulation  Type of Nipple: Everted at rest and after stimulation  Comfort (Breast/Nipple): Soft / non-tender  Hold (Positioning): No assistance needed to correctly position infant at breast.  LATCH Score: 9  Interventions    Lactation Tools Discussed/Used     Consult Status Consult Status: Follow-up Date: 04/24/17 Follow-up type: In-patient    Rulon Eisenmenger 04/23/2017, 4:07 PM

## 2017-04-23 NOTE — Lactation Note (Signed)
This note was copied from a baby's chart. Lactation Consultation Note  Patient Name: Kelsey Hanson [id number] Date: 04/23/2017 Reason for consult: Follow-up assessment Baby at 38 hr of life. Upon entry dyad was sleeping. Left instructions with support person to have mom call at the next feeding.   Maternal Data    Feeding Feeding Type: Breast Fed Length of feed: 60 min  LATCH Score Latch: Grasps breast easily, tongue down, lips flanged, rhythmical sucking.  Audible Swallowing: A few with stimulation  Type of Nipple: Everted at rest and after stimulation  Comfort (Breast/Nipple): Soft / non-tender  Hold (Positioning): No assistance needed to correctly position infant at breast.  LATCH Score: 9  Interventions    Lactation Tools Discussed/Used     Consult Status Consult Status: Follow-up Date: 04/23/17 Follow-up type: In-patient    Kelsey Hanson 04/23/2017, 2:44 PM

## 2017-04-23 NOTE — Progress Notes (Signed)
POSTPARTUM PROGRESS NOTE  Post Partum Day 1  Subjective:  Kelsey Hanson is a 21 y.o. G1P0101 s/p SVD at [redacted]w[redacted]d.  No acute events overnight.  Pt denies problems with ambulating, voiding or po intake.  She denies nausea or vomiting.  Pain is well controlled.  Lochia is moderate to large. Denies dizziness with ambulation.   Objective: Blood pressure 114/67, pulse 83, temperature (!) 97.5 F (36.4 C), resp. rate 18, height 5\' 4"  (1.626 m), weight 152 lb (68.9 kg), last menstrual period 08/17/2016, SpO2 99 %, unknown if currently breastfeeding.  Physical Exam:  General: alert, cooperative and no distress Chest: no respiratory distress Heart:regular rate, distal pulses intact Abdomen: soft, nontender,  Uterine Fundus: firm, appropriately tender DVT Evaluation: No calf swelling or tenderness Extremities: no edema Skin: warm, dry   Recent Labs  04/21/17 0850 04/22/17 0508  HGB 10.4* 10.6*  HCT 31.7* 31.5*    Assessment/Plan: Kelsey Hanson is a 21 y.o. G1P0101 s/p SVD at [redacted]w[redacted]d   PPD#1 - Doing well. Monitor lochia Contraception: continue to counsel Feeding: breast Dispo: Plan for discharge tomorrow.   LOS: 2 days   Kandra Nicolas DegeleMD 04/23/2017, 10:01 AM

## 2017-04-24 ENCOUNTER — Inpatient Hospital Stay (HOSPITAL_COMMUNITY)
Admission: AD | Admit: 2017-04-24 | Discharge: 2017-04-24 | Disposition: A | Discharge status: Home / Self Care | Payer: Medicaid Other | Source: Ambulatory Visit | Attending: Obstetrics & Gynecology | Admitting: Obstetrics & Gynecology

## 2017-04-24 ENCOUNTER — Encounter (HOSPITAL_COMMUNITY): Payer: Self-pay | Admitting: *Deleted

## 2017-04-24 DIAGNOSIS — N898 Other specified noninflammatory disorders of vagina: Secondary | ICD-10-CM | POA: Insufficient documentation

## 2017-04-24 DIAGNOSIS — R109 Unspecified abdominal pain: Secondary | ICD-10-CM | POA: Insufficient documentation

## 2017-04-24 DIAGNOSIS — Z803 Family history of malignant neoplasm of breast: Secondary | ICD-10-CM | POA: Insufficient documentation

## 2017-04-24 DIAGNOSIS — Z79899 Other long term (current) drug therapy: Secondary | ICD-10-CM | POA: Insufficient documentation

## 2017-04-24 DIAGNOSIS — Z818 Family history of other mental and behavioral disorders: Secondary | ICD-10-CM | POA: Insufficient documentation

## 2017-04-24 LAB — URINALYSIS, ROUTINE W REFLEX MICROSCOPIC
BACTERIA UA: NONE SEEN
BILIRUBIN URINE: NEGATIVE
Glucose, UA: NEGATIVE mg/dL
Ketones, ur: NEGATIVE mg/dL
LEUKOCYTES UA: NEGATIVE
NITRITE: NEGATIVE
PH: 6 (ref 5.0–8.0)
Protein, ur: NEGATIVE mg/dL
SPECIFIC GRAVITY, URINE: 1.009 (ref 1.005–1.030)

## 2017-04-24 NOTE — Lactation Note (Signed)
This note was copied from a baby's chart. Lactation Consultation Note  Patient Name: Kelsey Hanson [id number] Date: 04/24/2017 Reason for consult: Follow-up assessment Baby at 57 hr of life. Upon entry baby was sleeping in visitor's arms and mom was using DEBP on the L breast. Mom reports baby is latching well. She stated her nipples were cracked last night so she started using olive oil. No skin break down or bruising noted at this visit. She reports offering 3-5 spoons of expressed milk after each bf but did not write it down. Encouraged mom to offer the breast on demand q3hr for no more than 20-25 minutes, post express, and offer expressed milk per volume guidelines. She should be documenting all feedings on the yellow sheet so that staff can accurately update the chart. Reviewed LPT infant guidelines. Instructed mom to call at the next bf for staff to see a latch.    Maternal Data    Feeding Feeding Type: Breast Fed Length of feed: 26 min  LATCH Score                   Interventions    Lactation Tools Discussed/Used     Consult Status Consult Status: Follow-up Date: 04/24/17 Follow-up type: In-patient    Rulon Eisenmenger 04/24/2017, 10:19 AM

## 2017-04-24 NOTE — MAU Note (Signed)
Pt presents to MAU with c/o large amount l yellow vaginal discharge.  Denies vaginal itching.  Reports NVD on 04/22/17, intact perineum.  Pt also reports during BM (2nd since delivery) today experienced chest pain.  Denies chest pain now.  Pt discharged from hospital today.

## 2017-04-24 NOTE — Progress Notes (Signed)
Kelsey Hanson, CNM into see pt and eval status.  Pt informed she needed pelvic exam to assess c/o, pt agreeableto POC.  Pt places in stirups, pt refused exam prior to speculum insertion.

## 2017-04-24 NOTE — MAU Note (Addendum)
Pt reports yellow "sticky" discharge that she has noticed since delivery on 04/22/2017. States she poops it is very painful and her stomach hurts a lot. States her bleeding has lightened up. States she had a sharp "heartburn" like pain when she used the bathroom. Does not have the pain now. States she was discharged today and baby is in NICU.

## 2017-04-24 NOTE — MAU Provider Note (Signed)
History     CSN: [id number]  Arrival date and time: 04/24/17 2114   First Provider Initiated Contact with Patient 04/24/17 2224      Chief Complaint  Patient presents with  . Vaginal Discharge   HPI  Ms. Kelsey Hanson is 21 yo G1P0101 2 days PP with complaints of sticky yellow-white vaginal d/c since her vaginal delivery on 04/22/17, painful poops and abdominal pain, "heartburn" like pain when she uses the BR.  Denies pain now.  She was d/c'd today from hospital; baby is in NICU.   Past Medical History:  Diagnosis Date  . Medical history non-contributory     Past Surgical History:  Procedure Laterality Date  . NO PAST SURGERIES    . TOOTH EXTRACTION      Family History  Problem Relation Age of Onset  . Breast cancer Maternal Aunt   . Mental retardation Brother   . Asperger's syndrome Brother     Social History  Substance Use Topics  . Smoking status: Never Smoker  . Smokeless tobacco: Never Used  . Alcohol use No    Allergies: No Known Allergies  Prescriptions Prior to Admission  Medication Sig Dispense Refill Last Dose  . acetaminophen (TYLENOL) 325 MG tablet Take 650 mg by mouth every 6 (six) hours as needed for mild pain or headache.    prn  . Cholecalciferol (VITAMIN D PO) Take 2,000 mcg by mouth daily. Liquid Vitamin D   04/20/2017 at Unknown time  . Doxylamine-Pyridoxine (DICLEGIS) 10-10 MG TBEC Take 1 tablet with breakfast and lunch.  Take 2 tablets at bedtime. 100 tablet 4 04/21/2017 at Unknown time  . Prenatal-DSS-FeCb-FeGl-FA (CITRANATAL BLOOM) 90-1 MG TABS Take 1 tablet by mouth daily. 30 tablet 12 04/20/2017 at Unknown time  . promethazine (PHENERGAN) 12.5 MG tablet Take 1 tablet (12.5 mg total) by mouth every 6 (six) hours as needed for nausea or vomiting. 30 tablet 2 04/21/2017 at Unknown time  . Vitamin D, Ergocalciferol, (DRISDOL) 50000 units CAPS capsule Take 1 capsule (50,000 Units total) by mouth every 7 (seven) days. (Patient taking differently:  Take 50,000 Units by mouth every 7 (seven) days. mondays) 30 capsule 2 04/14/2017    Review of Systems  Constitutional: Negative.   HENT: Negative.   Eyes: Negative.   Respiratory: Negative.   Cardiovascular: Negative.   Gastrointestinal: Positive for abdominal pain.  Endocrine: Negative.   Genitourinary: Positive for vaginal discharge.  Musculoskeletal: Negative.   Skin: Negative.   Allergic/Immunologic: Negative.   Neurological: Negative.   Hematological: Negative.   Psychiatric/Behavioral: Negative.    Physical Exam   Blood pressure 139/76, pulse 97, temperature 98.3 F (36.8 C), temperature source Oral, resp. rate 16, height 5\' 4"  (1.626 m), weight 65.8 kg (145 lb), last menstrual period 08/17/2016, SpO2 99 %, unknown if currently breastfeeding.  Physical Exam  Constitutional: She is oriented to person, place, and time. She appears well-developed and well-nourished.  HENT:  Head: Normocephalic.  Eyes: Pupils are equal, round, and reactive to light.  Neck: Normal range of motion.  Cardiovascular: Normal rate.   Respiratory: Effort normal and breath sounds normal.  GI: Soft.  Genitourinary:  Genitourinary Comments: Patient declined speculum and vaginal exam  Musculoskeletal: Normal range of motion.  Neurological: She is alert and oriented to person, place, and time.  Skin: Skin is warm and dry.  Psychiatric: Her behavior is normal. Judgment and thought content normal.  Patient very tearful as prepping for pelvic exam -- offered to stop, if patient  did not want exam / patient declined pelvic exam and testing    MAU Course  Procedures  MDM CCUA  Results for orders placed or performed during the hospital encounter of 04/24/17 (from the past 24 hour(s))  Urinalysis, Routine w reflex microscopic     Status: Abnormal   Collection Time: 04/24/17  9:21 PM  Result Value Ref Range   Color, Urine YELLOW YELLOW   APPearance CLEAR CLEAR   Specific Gravity, Urine 1.009 1.005  - 1.030   pH 6.0 5.0 - 8.0   Glucose, UA NEGATIVE NEGATIVE mg/dL   Hgb urine dipstick MODERATE (A) NEGATIVE   Bilirubin Urine NEGATIVE NEGATIVE   Ketones, ur NEGATIVE NEGATIVE mg/dL   Protein, ur NEGATIVE NEGATIVE mg/dL   Nitrite NEGATIVE NEGATIVE   Leukocytes, UA NEGATIVE NEGATIVE   RBC / HPF 0-5 0 - 5 RBC/hpf   WBC, UA 0-5 0 - 5 WBC/hpf   Bacteria, UA NONE SEEN NONE SEEN   Squamous Epithelial / LPF 0-5 (A) NONE SEEN   Mucus PRESENT     Assessment and Plan  Vaginal discharge - Reassurance given that vaginal d/c can be yellow at times after vaginal discharge - Advised not able to dx if vaginal d/c is abnormal without examining it - Keep PP appt, call [facility] for any further problems  Discharge home Patient verbalized an understanding of the plan of care and agrees.   Raelyn Mora, MSN, CNM 04/24/2017, 10:29 PM

## 2017-04-24 NOTE — Lactation Note (Signed)
This note was copied from a baby's chart. Lactation Consultation Note Baby 36 hours old just had 1st stool and was formed. Has had 7 voids in life. BF well per mom. Mom has DEBP not using it. Mom states she is BF a lot. Asked about supplementing d/t LPI and 5.9lbs, no stool until 52 hrs old. Mom stated she is giving colostrum as supplement. Reviewed LPI information sheet and supplementing amount according to hours of age. Mom stated that she is hand expressing and spoon feeding. Asked how much she is getting, mom stated spoon full. Hours of age baby should take 20-30 ml supplement after BF. Mom stated yes he's getting a lot. Asked mom if she was getting that much, she stated spoon full. Informed mom a spoon full is 5 ml. Mom didn't say anything. Asked mom if she wants to use formula after giving the spoon full, mom stated "no formula". LC encouraged mom to discussed w/MD baby's feedings and output. Congratulated mom on the birth of her baby. Reported to RN of consult.  Patient Name: Kelsey Hanson [id number] Date: 04/24/2017 Reason for consult: Follow-up assessment;Infant < 6lbs;Late-preterm 34-36.6wks   Maternal Data    Feeding Feeding Type: Breast Fed Length of feed:  (currently BF)  LATCH Score Latch: Grasps breast easily, tongue down, lips flanged, rhythmical sucking.  Audible Swallowing: A few with stimulation  Type of Nipple: Everted at rest and after stimulation  Comfort (Breast/Nipple): Soft / non-tender  Hold (Positioning): No assistance needed to correctly position infant at breast.  LATCH Score: 9  Interventions    Lactation Tools Discussed/Used     Consult Status Consult Status: Follow-up Date: 04/24/17 Follow-up type: In-patient    Charyl Dancer 04/24/2017, 4:51 AM

## 2017-04-24 NOTE — Progress Notes (Signed)
Patient instructed not to sleep with baby in bed.  Patient verbalized understanding

## 2017-04-24 NOTE — Discharge Summary (Signed)
OB Discharge Summary     Patient Name: Kelsey Hanson DOB: February 06, 1996 MRN: [id number]  Date of admission: 04/21/2017 Delivering MD: Cam Hai D   Date of discharge: 04/24/2017  Admitting diagnosis: 35 WEEKS LEAKING FLUID STOMACH PAIN Intrauterine pregnancy: [redacted]w[redacted]d     Secondary diagnosis:  Principal Problem:   SVD (spontaneous vaginal delivery) Active Problems:   Preterm premature rupture of membranes (PPROM) with unknown onset of labor  Additional problems: None     Discharge diagnosis: Preterm Pregnancy Delivered                                                                                                Post partum procedures:none  Augmentation: Pitocin and Cytotec  Complications: None  Hospital course:  Induction of Labor With Vaginal Delivery   21 y.o. yo G1P0101 at [redacted]w[redacted]d was admitted to the hospital 04/21/2017 for induction of labor.  Indication for induction: PPROM.  Patient had an uncomplicated labor course as follows: Membrane Rupture Time/Date: 2:40 AM ,04/21/2017   Intrapartum Procedures: Episiotomy: None [1]                                         Lacerations:  None [1]  Patient had delivery of a Viable infant.  Information for the patient's newborn:  Bianey, Tesoro [[id number]]  Delivery Method: Vag-Spont   04/22/2017  Details of delivery can be found in separate delivery note.  Patient had a routine postpartum course. Patient is discharged home 04/24/17.  Physical exam  Vitals:   04/22/17 2140 04/23/17 0601 04/23/17 1749 04/24/17 0519  BP: (!) 126/57 114/67 117/69 129/80  Pulse: 86 83 89 92  Resp:  18 18 16   Temp:  (!) 97.5 F (36.4 C) 98 F (36.7 C) 98 F (36.7 C)  TempSrc:   Oral Oral  SpO2:   100% 99%  Weight:      Height:       General: alert, cooperative and no distress Lochia: appropriate Uterine Fundus: firm Incision: N/A DVT Evaluation: No evidence of DVT seen on physical exam. Labs: Lab Results  Component Value Date   WBC  20.1 (H) 04/22/2017   HGB 10.6 (L) 04/22/2017   HCT 31.5 (L) 04/22/2017   MCV 85.4 04/22/2017   PLT 238 04/22/2017   No flowsheet data found.  Discharge instruction: per After Visit Summary and "Baby and Me Booklet". Patient instructed to abstain from sex fpr 6 weeks post partum. Anticipatory guidance given regarding post-partum depression, DVT, safety. Return precautions given for new symptoms or worsening bleeding/pain.  After visit meds:  Allergies as of 04/24/2017   No Known Allergies     Medication List    TAKE these medications   acetaminophen 325 MG tablet Commonly known as:  TYLENOL Take 650 mg by mouth every 6 (six) hours as needed for mild pain or headache.   CITRANATAL BLOOM 90-1 MG Tabs Take 1 tablet by mouth daily.   Doxylamine-Pyridoxine 10-10 MG Tbec Commonly known as:  DICLEGIS Take 1  tablet with breakfast and lunch.  Take 2 tablets at bedtime.   promethazine 12.5 MG tablet Commonly known as:  PHENERGAN Take 1 tablet (12.5 mg total) by mouth every 6 (six) hours as needed for nausea or vomiting.   Vitamin D (Ergocalciferol) 50000 units Caps capsule Commonly known as:  DRISDOL Take 1 capsule (50,000 Units total) by mouth every 7 (seven) days. What changed:  additional instructions   VITAMIN D PO Take 2,000 mcg by mouth daily. Liquid Vitamin D            Discharge Care Instructions        Start     Ordered   04/24/17 0000  Call MD for:  temperature >100.4     04/24/17 0849   04/24/17 0000  Call MD for:  persistant nausea and vomiting     04/24/17 0849   04/24/17 0000  Call MD for:  severe uncontrolled pain     04/24/17 0849   04/24/17 0000  Call MD for:  difficulty breathing, headache or visual disturbances     04/24/17 0849   04/24/17 0000  Call MD for:  persistant dizziness or light-headedness     04/24/17 0849   04/24/17 0000  Sexual acrtivity    Comments:  No sexual activity or anything in the vagina for 6 weeks   04/24/17 0849    04/24/17 0000  Diet - low sodium heart healthy     04/24/17 0849   04/21/17 0000  OB RESULTS CONSOLE GC/Chlamydia    Comments:  This external order was created through the Results Console.   04/21/17 2317      Diet: low salt diet  Activity: Advance as tolerated. Pelvic rest for 6 weeks.   Outpatient follow up:4 weeks Follow up Appt:Future Appointments Date Time Provider Department Center  05/20/2017 9:00 AM Adam Phenix, MD [facility] None   Follow up Visit:No Follow-up on file.  Postpartum contraception: None  Newborn Data: Live born female  Birth Weight: 5 lb 13 oz (2635 g) APGAR: 8, 9  Baby Feeding: Bottle and Breast Disposition:home with mother  04/24/2017 Dannette Barbara, Medical Student

## 2017-04-25 ENCOUNTER — Ambulatory Visit: Payer: Self-pay

## 2017-04-25 ENCOUNTER — Encounter: Payer: Medicaid Other | Admitting: Certified Nurse Midwife

## 2017-04-25 NOTE — Lactation Note (Signed)
This note was copied from a baby's chart. Lactation Consultation Note: Mother reports that infant is feeding well. She reports that her breast are getting heavy. Informed mother that her milk Is coming in. Mother has 20 ml of ebm pumped at the bedside. She plans to bottle feed infant with ebm after breastfeeding. Mother has infant resting on chest. Discussed treatment to prevent and treat engorgement. Advised mother to allow for cluster feeding . Mother encouraged to continue to post pump after each feeding for 15-20 mins. Mother was given a harmony hand pump for use at home as needed. Mother to follow up with [facility] services and community support. Suggested that mother schedule a follow up with [facility] out patient dept. Mother was given phone number for [facility] office for appt and to discuss any questions or breastfeeding concerns. Advised mother to rouse infant for 8-12 feedings in 24 hours. Mother receptive to all teaching.   Patient Name: Kelsey Hanson [id number] Date: 04/25/2017 Reason for consult: Follow-up assessment   Maternal Data    Feeding Feeding Type: Breast Fed Length of feed: 8 min  LATCH Score Latch: Grasps breast easily, tongue down, lips flanged, rhythmical sucking.  Audible Swallowing: A few with stimulation  Type of Nipple: Everted at rest and after stimulation  Comfort (Breast/Nipple): Soft / non-tender  Hold (Positioning): No assistance needed to correctly position infant at breast.  LATCH Score: 9  Interventions    Lactation Tools Discussed/Used     Consult Status      Michel Bickers 04/25/2017, 11:09 AM

## 2017-05-12 IMAGING — US US OB TRANSVAGINAL
1 series · 15 of 28 positions shown · non-contrast
Comparison: None.

CLINICAL DATA: Acute onset of vaginal bleeding.  Initial encounter.

EXAM:
OBSTETRIC <14 WK US AND TRANSVAGINAL OB US
TECHNIQUE: Both transabdominal and transvaginal ultrasound examinations were
performed for complete evaluation of the gestation as well as the
maternal uterus, adnexal regions, and pelvic cul-de-sac.
Transvaginal technique was performed to assess early pregnancy.

[Series 1: us ob transvaginal · 83 acquisitions, 15 frames shown]
[im 1/83]
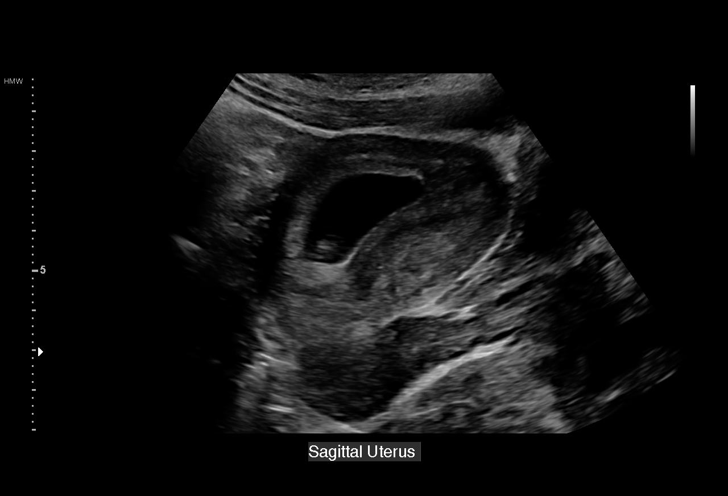
[im 7/83]
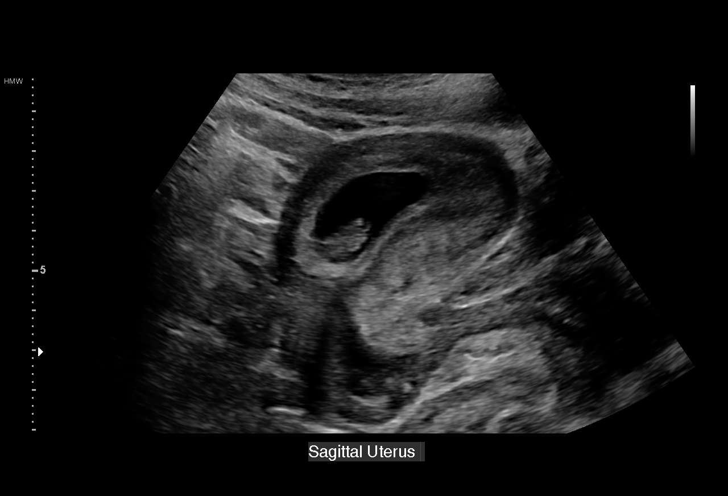
[im 13/83]
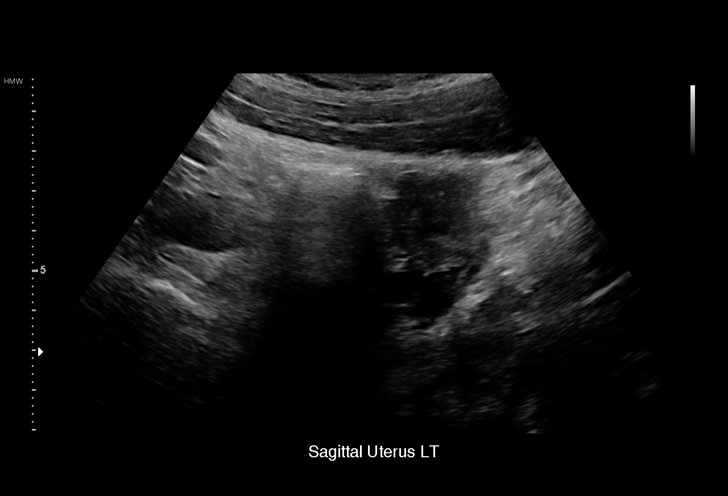
[im 19/83]
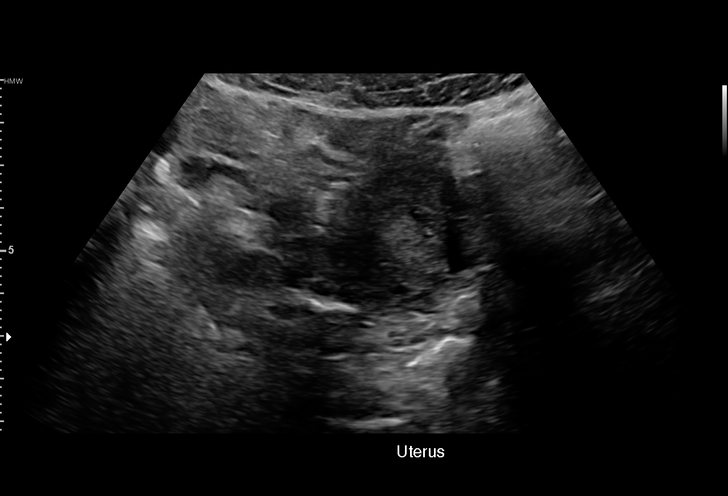
[im 25/83]
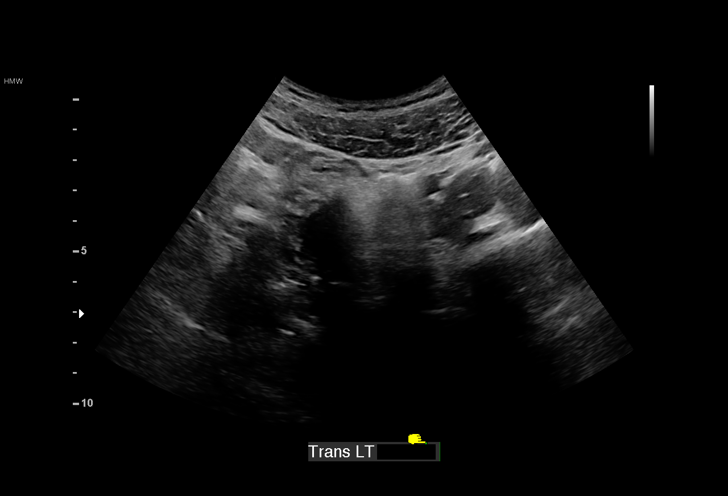
[im 31/83]
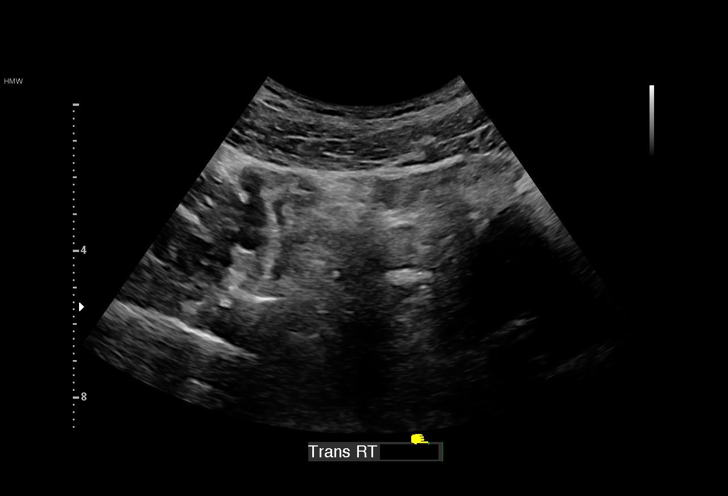
[im 37/83]
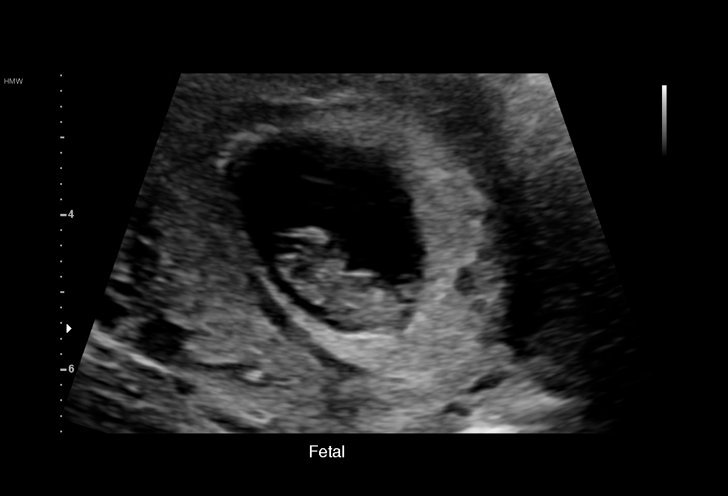
[im 43/83]
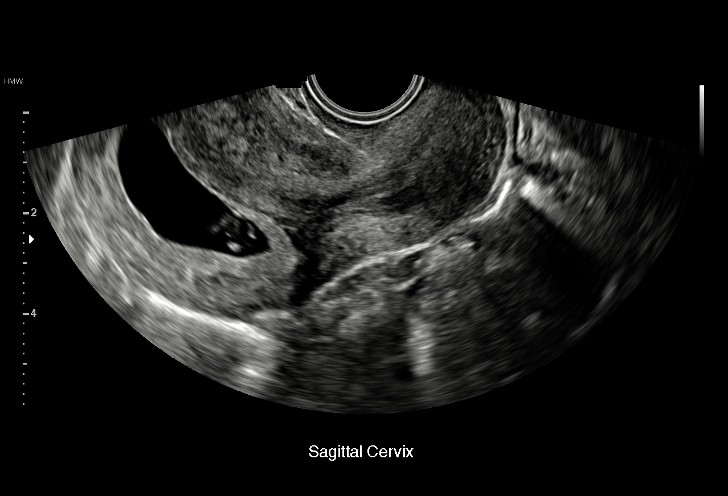
[im 46/83]
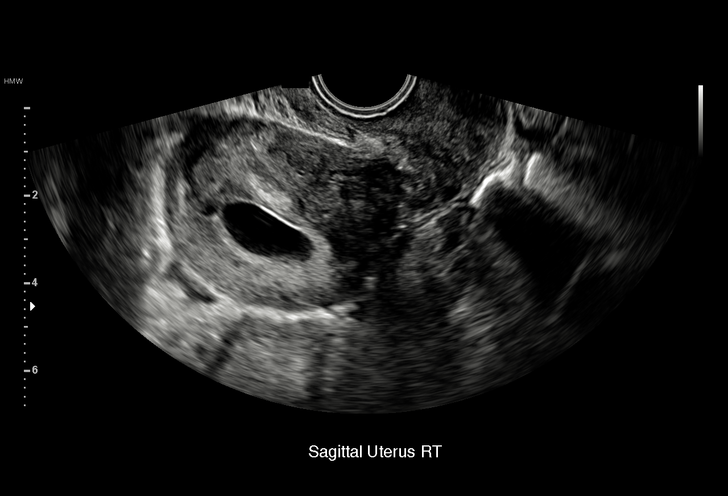
[im 52/83]
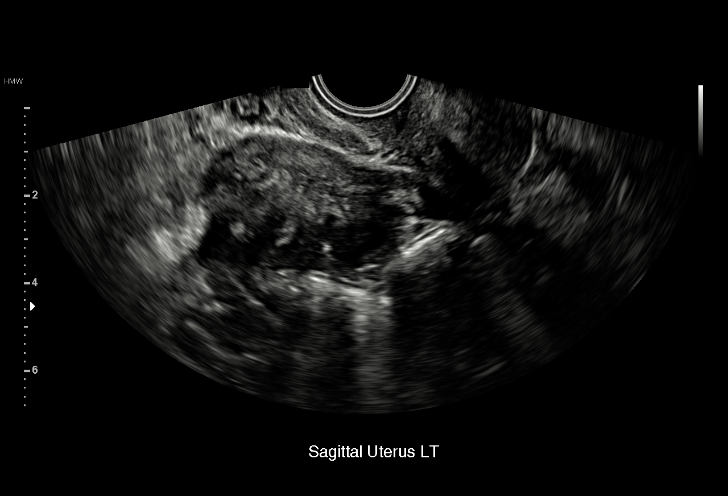
[im 58/83]
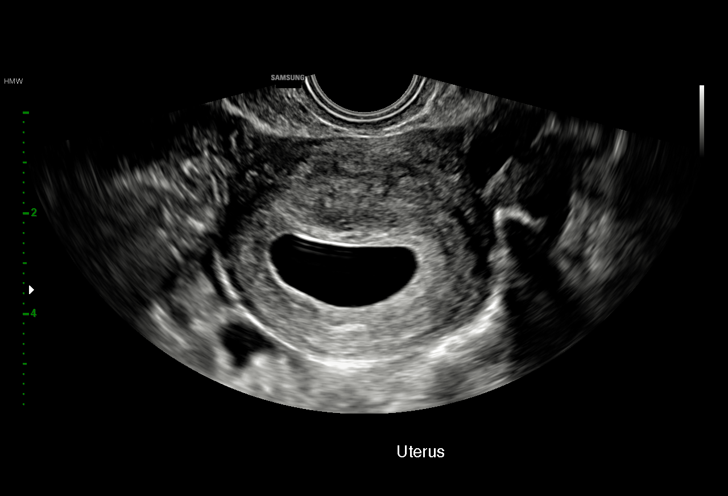
[im 64/83]
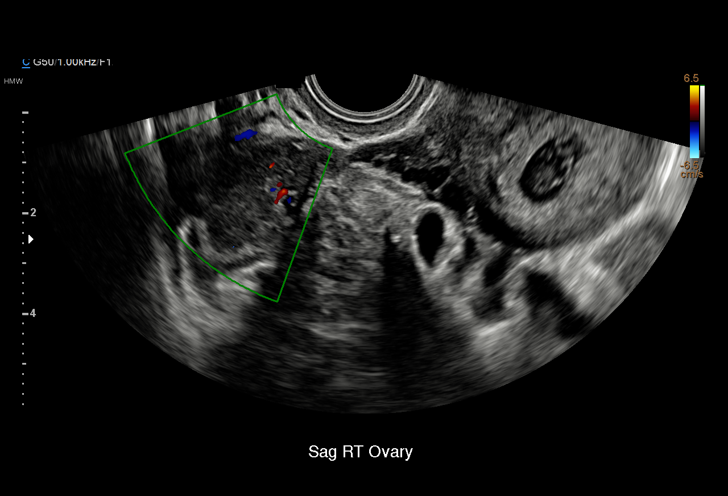
[im 70/83]
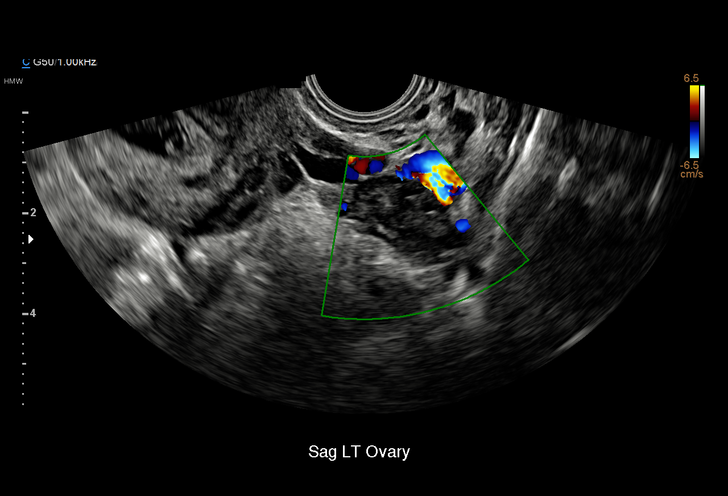
[im 76/83]
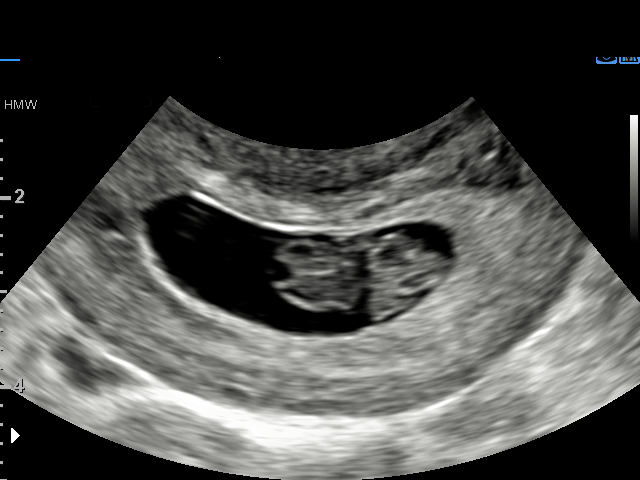
[im 83/83]
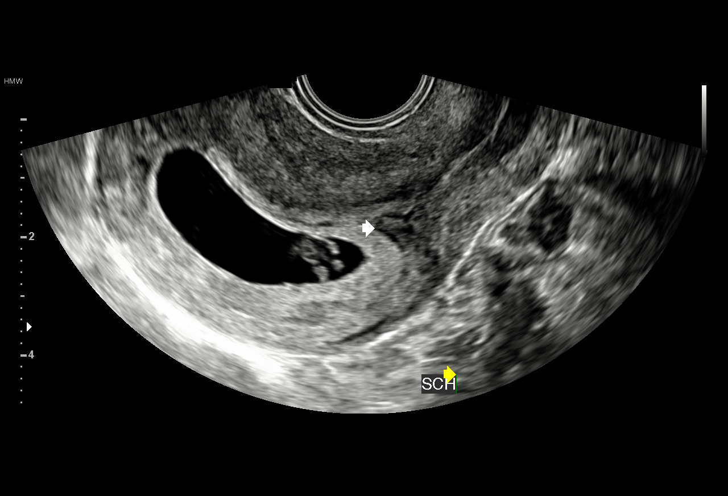

[15 of 28 positions shown; findings below may reference images not displayed]

FINDINGS: Intrauterine gestational sac: Single; visualized and normal in
shape.

Yolk sac:  Yes

Embryo:  Yes

Cardiac Activity: Yes

Heart Rate: 183  bpm

CRL:  1.96 cm   8 w   3 d                  US EDC: 05/23/2017

Subchorionic hemorrhage: A small amount of subchorionic hemorrhage
is noted.

Maternal uterus/adnexae: The uterus otherwise unremarkable
appearance

The ovaries are within normal limits. The right ovary measures 2.8 x
2.2 x 2.2 cm, while the left ovary measures 2.3 x 1.4 x 1.8 cm. No
suspicious adnexal masses are seen; there is no evidence for ovarian
torsion.

No free fluid is seen within the pelvic cul-de-sac.
IMPRESSION: 1. Single live intrauterine pregnancy noted, with a crown-rump
length of 2.0 cm, corresponding to a gestational age of 8 weeks 3
days. This matches the gestational age of 8 weeks 2 days by LMP,
reflecting an estimated date of delivery May 24, 2017.
2. Small amount of subchorionic hemorrhage noted.

## 2017-05-20 ENCOUNTER — Ambulatory Visit (INDEPENDENT_AMBULATORY_CARE_PROVIDER_SITE_OTHER): Payer: Medicaid Other | Admitting: Obstetrics & Gynecology

## 2017-05-20 ENCOUNTER — Encounter: Payer: Self-pay | Admitting: Obstetrics & Gynecology

## 2017-05-20 DIAGNOSIS — Z1389 Encounter for screening for other disorder: Secondary | ICD-10-CM

## 2017-05-20 DIAGNOSIS — R87612 Low grade squamous intraepithelial lesion on cytologic smear of cervix (LGSIL): Secondary | ICD-10-CM

## 2017-05-20 NOTE — Progress Notes (Signed)
Post Partum Exam  Kelsey Hanson is a 21 y.o. G65P0101 female who presents for a postpartum visit. She is 3 weeks postpartum following a spontaneous vaginal delivery. I have fully reviewed the prenatal and intrapartum course. The delivery was at 35 gestational weeks.  Anesthesia: none. Postpartum course has been good. Baby's course has been good. Baby is feeding by breast. Bleeding thin lochia. Bowel function is normal. Bladder function is normal. Patient is not sexually active. Contraception method is none. Postpartum depression screening:pos  The following portions of the patient's history were reviewed and updated as appropriate: allergies, current medications, past family history, past medical history, past social history, past surgical history and problem list.  Review of Systems Pertinent items are noted in HPI.    Objective:  unknown if currently breastfeeding.  General:  alert, cooperative and no distress           Abdomen: soft, non-tender; bowel sounds normal; no masses,  no organomegaly   Vulva:  not evaluated  Vagina: not evaluated                    Assessment:    normal postpartum exam. Pap smear not done at today's visit.   Plan:   1. Contraception: abstinence 2. Offered to see [facility] for postpartum depression score elevation and she declined 3. Follow up in: 8 months for pap or as needed.   Adam Phenix, MD 05/20/2017

## 2017-05-20 NOTE — Patient Instructions (Signed)

## 2017-08-14 IMAGING — US US MFM OB COMP +14 WKS
1 series · 14 of 28 positions shown · non-contrast
Comparison: none

[Series 1: us mfm ob comp +14 wks · 14 of 111 slices shown]
[im 5/111]
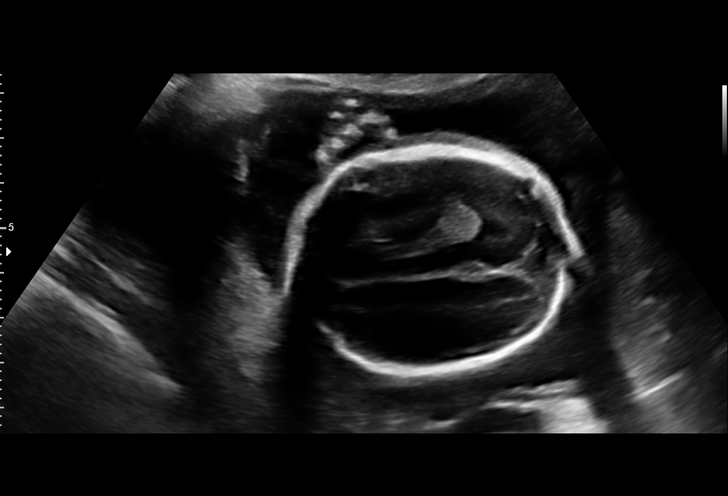
[im 13/111]
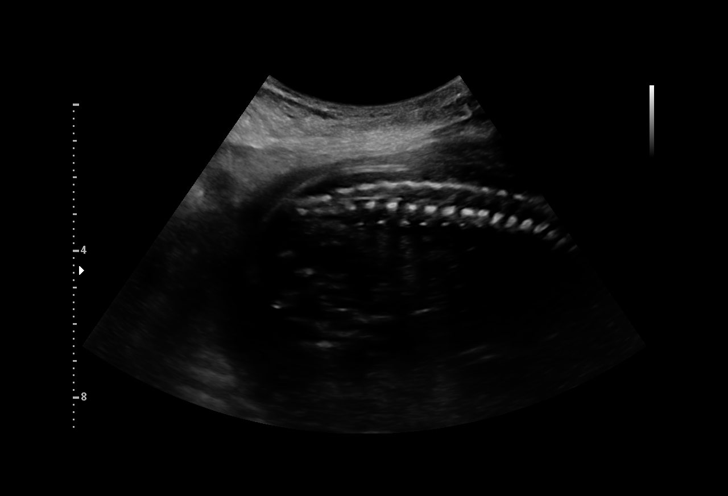
[im 21/111]
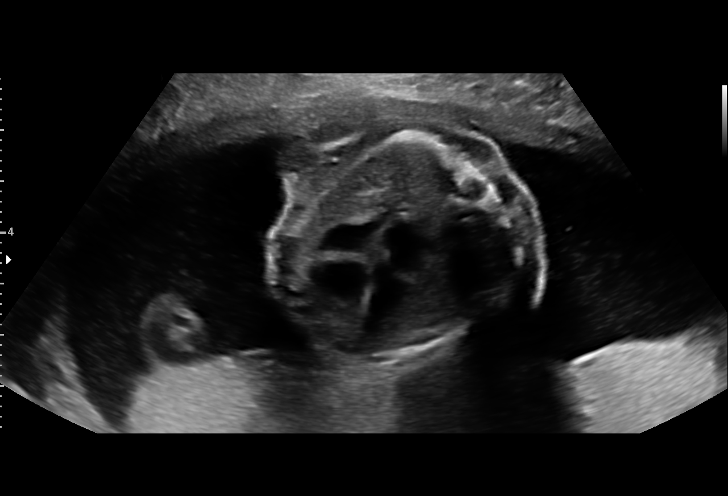
[im 29/111]
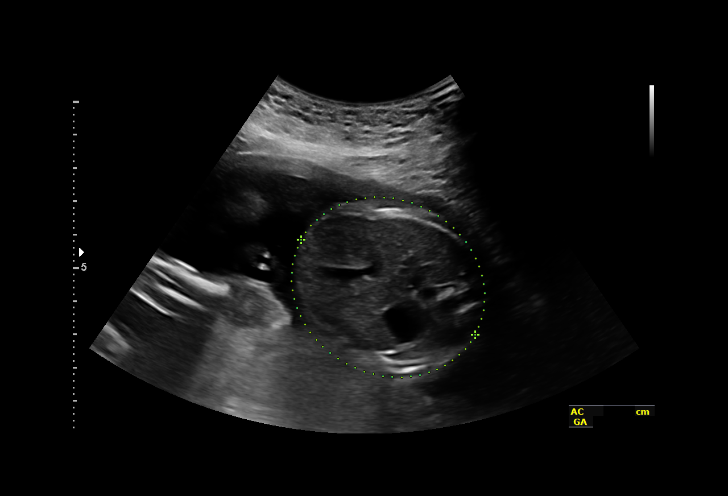
[im 37/111]
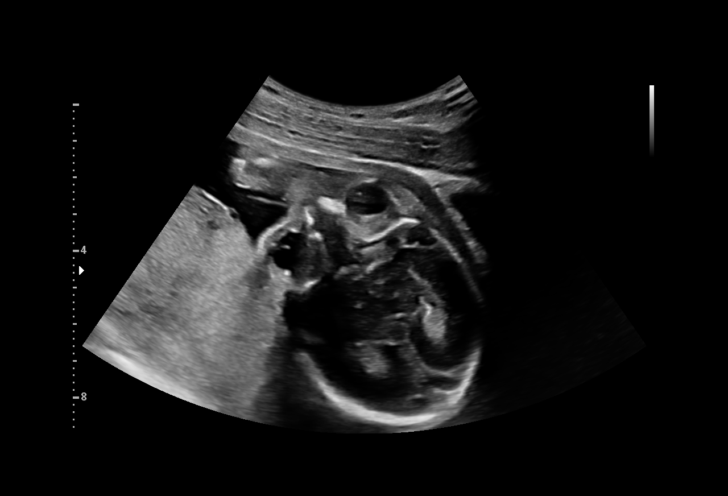
[im 45/111]
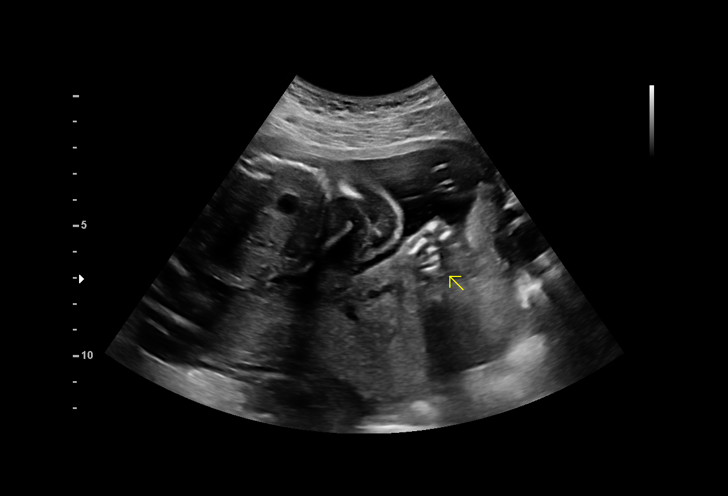
[im 53/111]
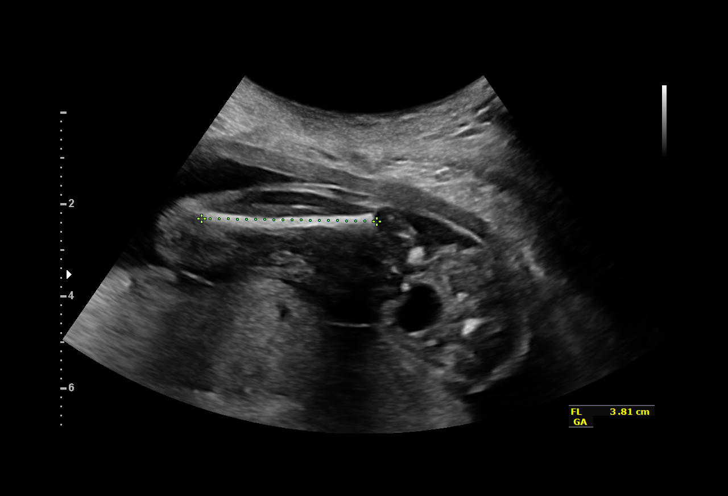
[im 62/111]
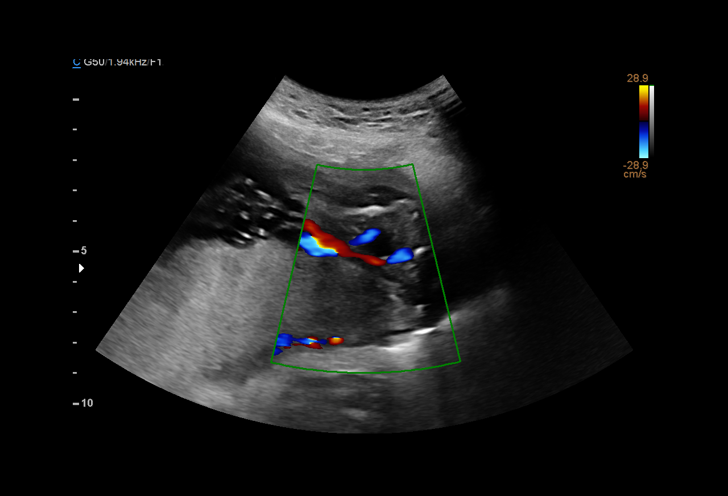
[im 70/111]
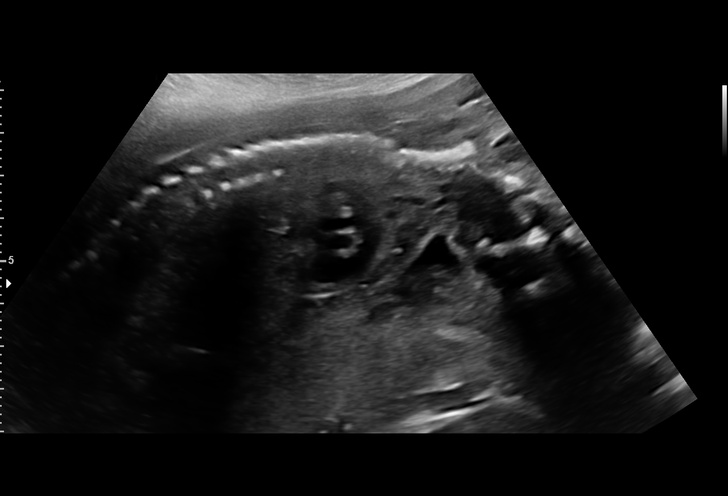
[im 78/111]
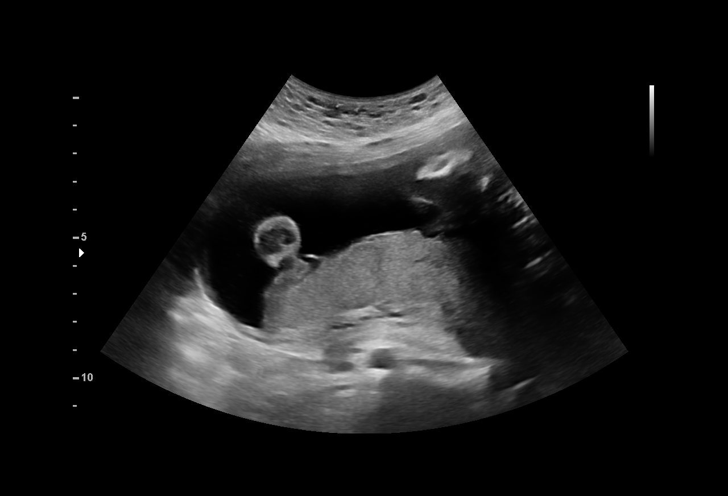
[im 86/111]
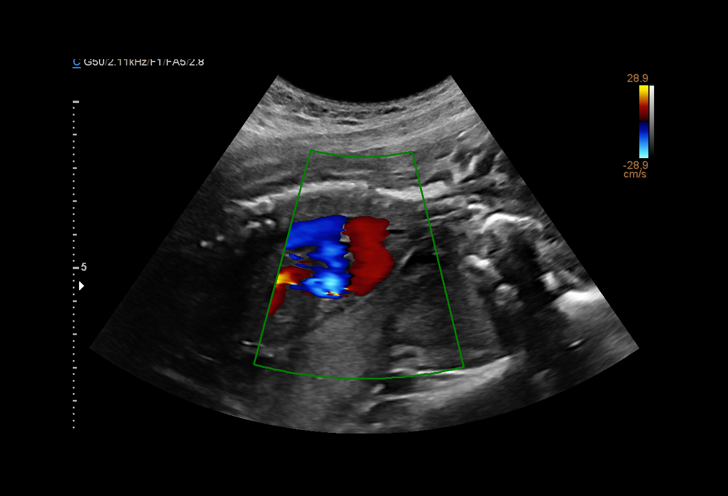
[im 94/111]
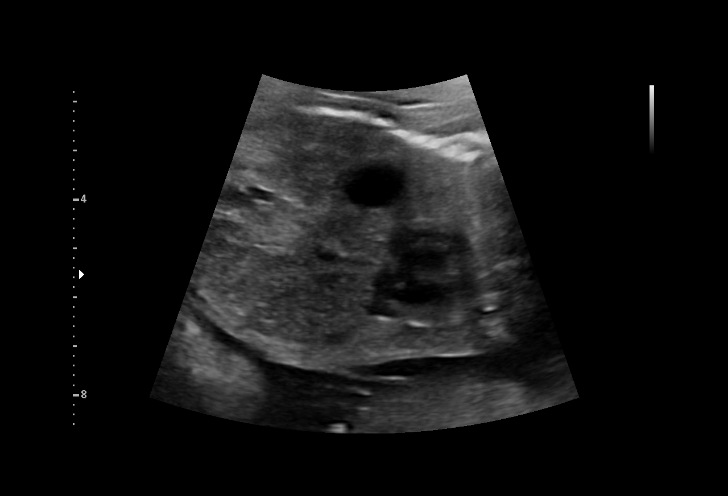
[im 102/111]
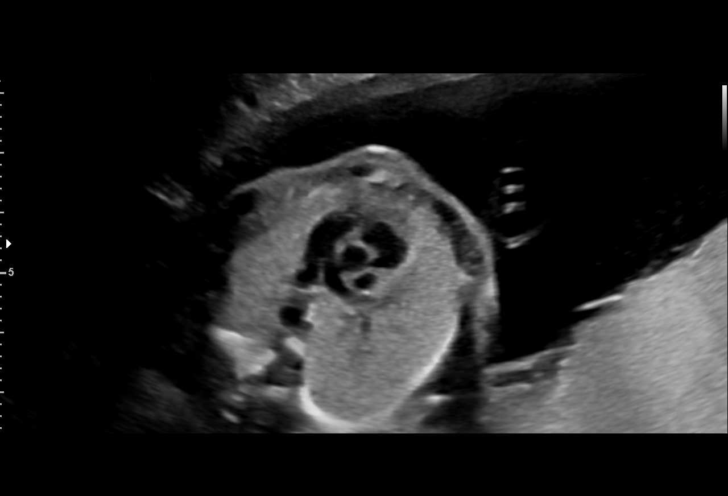
[im 111/111]
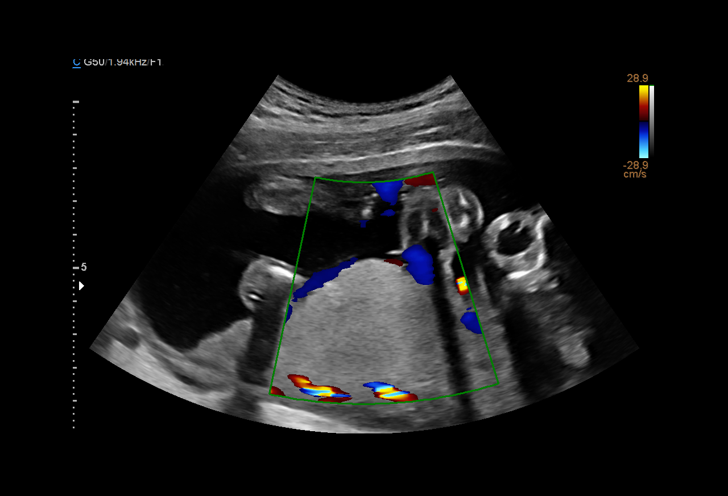

[14 of 28 positions shown; findings below may reference images not displayed]

Road [HOSPITAL]

Indications

21 weeks gestation of pregnancy
Late prenatal care, second trimester
Encounter for fetal anatomic survey
OB History

Blood Type:            Height:  5'4"   Weight (lb):  127      BMI:
Gravidity:    1
Fetal Evaluation

Num Of Fetuses:     1
Fetal Heart         161
Rate(bpm):
Cardiac Activity:   Observed
Presentation:       Cephalic
Placenta:           Posterior, above cervical os
P. Cord Insertion:  Visualized

Amniotic Fluid
AFI FV:      Subjectively within normal limits

Largest Pocket(cm)
4.14
Biometry

BPD:      51.5  mm     G. Age:  21w 5d         38  %    CI:        72.62   %   70 - 86
FL/HC:      19.7   %   18.4 -
HC:      192.2  mm     G. Age:  21w 4d         24  %    HC/AC:      1.10       1.06 -
AC:      174.4  mm     G. Age:  22w 2d         59  %    FL/BPD:     73.6   %   71 - 87
FL:       37.9  mm     G. Age:  22w 1d         49  %    FL/AC:      21.7   %   20 - 24
HUM:      37.8  mm     G. Age:  23w 2d         85  %
CER:      23.6  mm     G. Age:  21w 6d         48  %
CM:        6.6  mm

Est. FW:     480  gm      1 lb 1 oz     52  %
Gestational Age

LMP:           21w 5d       Date:   08/17/16                 EDD:   05/24/17
U/S Today:     22w 0d                                        EDD:   05/22/17
Best:          21w 6d    Det. By:   Early Ultrasound         EDD:   05/23/17
(10/14/16)
Anatomy

Cranium:               Appears normal         Aortic Arch:            Appears normal
Cavum:                 Appears normal         Ductal Arch:            Appears normal
Ventricles:            Appears normal         Diaphragm:              Appears normal
Choroid Plexus:        Appears normal         Stomach:                Appears normal, left
sided
Cerebellum:            Appears normal         Abdomen:                Appears normal
Posterior Fossa:       Appears normal         Abdominal Wall:         Appears nml (cord
insert, abd wall)
Nuchal Fold:           Not applicable (>20    Cord Vessels:           Appears normal (3
wks GA)                                        vessel cord)
Face:                  Appears normal         Kidneys:                Appear normal
(orbits and profile)
Lips:                  Appears normal         Bladder:                Appears normal
Thoracic:              Appears normal         Spine:                  Limited views
appear normal
Heart:                 Appears normal         Upper Extremities:      Appears normal
(4CH, axis, and situs
RVOT:                  Appears normal         Lower Extremities:      Appears normal
LVOT:                  Appears normal

Other:  Mother does not wish to know sex of fetus.  Fetus appears to be a
male.
Cervix Uterus Adnexa

Cervix
Length:           3.89  cm.
Normal appearance by transabdominal scan.

Uterus
No abnormality visualized.

Left Ovary
Not visualized.

Right Ovary
Not visualized.
Impression

Singleton intrauterine pregnancy at 21+6 weeks, here for
anatomic survey
Review of the anatomy shows no sonographic markers for
aneuploidy or structural anomalies
Amniotic fluid volume is normal
Estimated fetal weight is 480g which is growth in the 52nd
percentile
Recommendations

Follow-up ultrasounds as clinically indicated.
